# Patient Record
Sex: Male | Born: 1970 | Race: White | Hispanic: No | Marital: Married | Smoking: Former smoker
Health system: Southern US, Community
[De-identification: ages and names within clinical notes are randomized; demographics above are authoritative.]

## PROBLEM LIST (undated history)

## (undated) DIAGNOSIS — N419 Inflammatory disease of prostate, unspecified: Secondary | ICD-10-CM

## (undated) DIAGNOSIS — K219 Gastro-esophageal reflux disease without esophagitis: Secondary | ICD-10-CM

## (undated) DIAGNOSIS — N189 Chronic kidney disease, unspecified: Secondary | ICD-10-CM

## (undated) HISTORY — DX: Chronic kidney disease, unspecified: N18.9

## (undated) HISTORY — DX: Inflammatory disease of prostate, unspecified: N41.9

---

## 1998-02-08 ENCOUNTER — Ambulatory Visit (HOSPITAL_COMMUNITY): Admission: RE | Admit: 1998-02-08 | Discharge: 1998-02-08 | Payer: Self-pay | Admitting: Urology

## 2003-06-01 ENCOUNTER — Ambulatory Visit (HOSPITAL_COMMUNITY): Admission: RE | Admit: 2003-06-01 | Discharge: 2003-06-01 | Payer: Self-pay | Admitting: Family Medicine

## 2003-06-17 ENCOUNTER — Ambulatory Visit (HOSPITAL_COMMUNITY): Admission: RE | Admit: 2003-06-17 | Discharge: 2003-06-17 | Payer: Self-pay | Admitting: Urology

## 2005-02-02 IMAGING — CT CT PELVIS W/ CM
1 of 4 series · 14 of 32 positions shown, 19 images · IV contrast (omnipaque)
Comparison: none

CLINICAL DATA: Right lower quadrant pain.
 CT ABDOMEN AND PELVIS WITH CONTRAST ? 06/01/03
 Multidetector helical CT imaging was performed through the abdomen and pelvis following dilute oral contrast and 150 cc of Omnipaque 300.
 CT ABDOMEN:
 There is mild right hydroureteronephrosis.  No stone is seen in the kidneys or proximal ureters. Please see pelvic CT report below. 
 A small low density lesion is seen in the right tip of the liver compatible with a simple cyst.  The spleen, pancreas, adrenals, and kidneys otherwise show no focal lesions.  The gallbladder and bowel are grossly unremarkable.  
 IMPRESSION
 Mild right hydroureteronephrosis.  Please see pelvic CT report below.
 CT PELVIS:
 The right ureter remains mildly dilated.  There is a 5 mm right ureterovesical junction stone.  The appendix is not definitively seen but no inflammatory process is seen in the right lower quadrant.  The bowel is grossly unremarkable.  No free fluid, free air, or adenopathy.
 5 mm right ureterovesical junction stone.

[Series 2: abd/pelvis 5.0 b30f · axial · 0.64mm/px · z∈[-663,-258]mm · 14 of 93 slices shown, 19 images]
[im 6/93  soft-tissue]
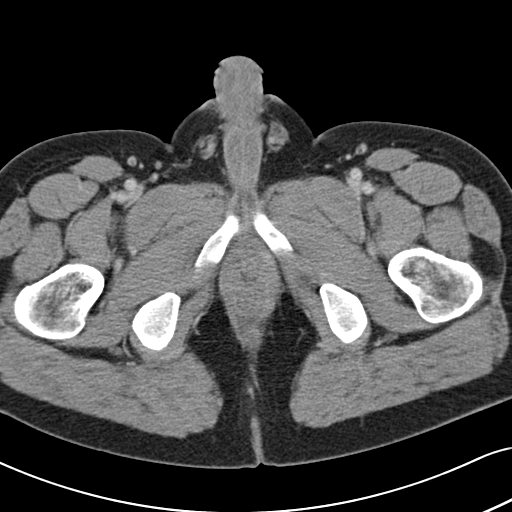
[im 6/93  bone]
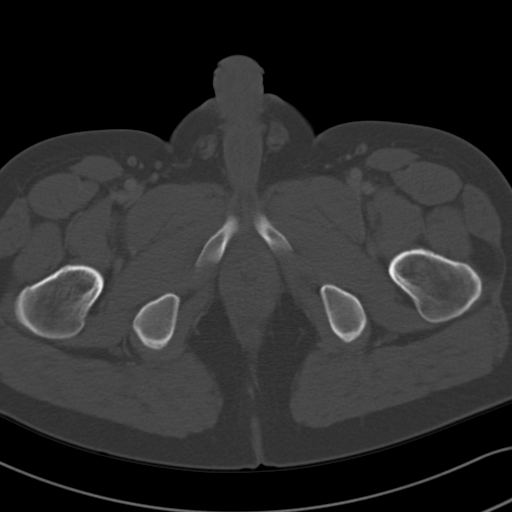
[im 11/93  soft-tissue]
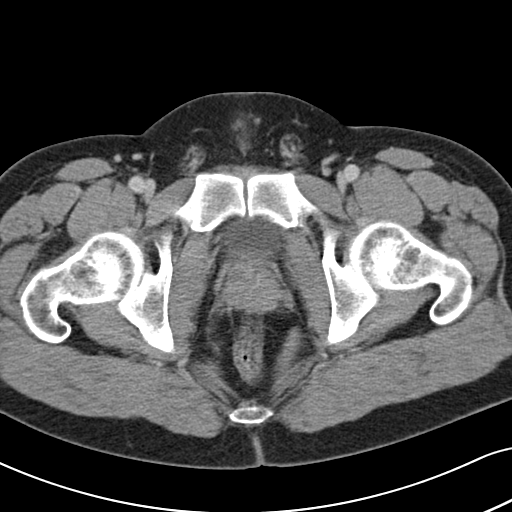
[im 22/93  soft-tissue]
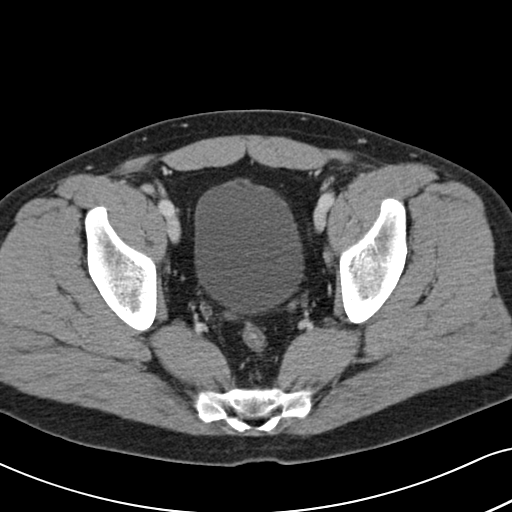
[im 28/93  soft-tissue]
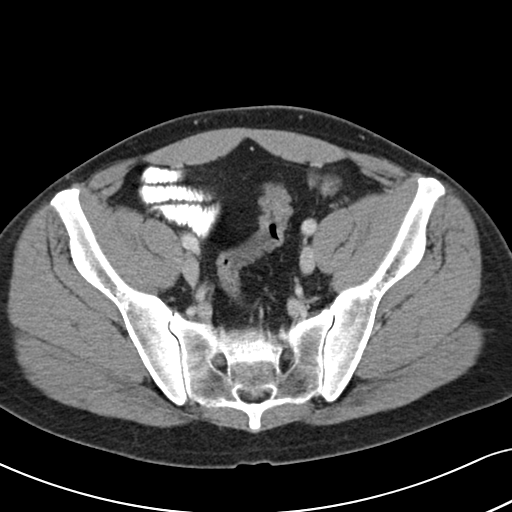
[im 33/93  soft-tissue]
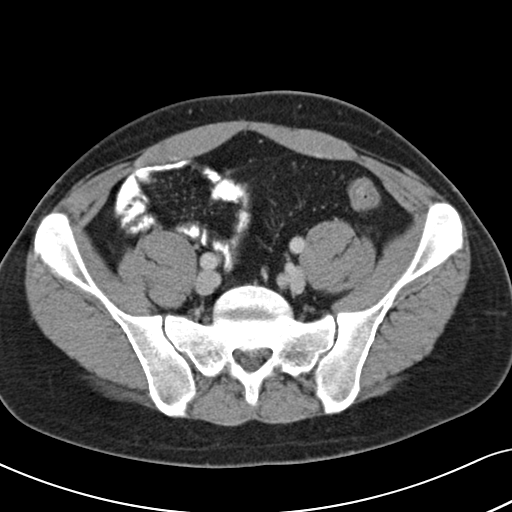
[im 38/93  soft-tissue]
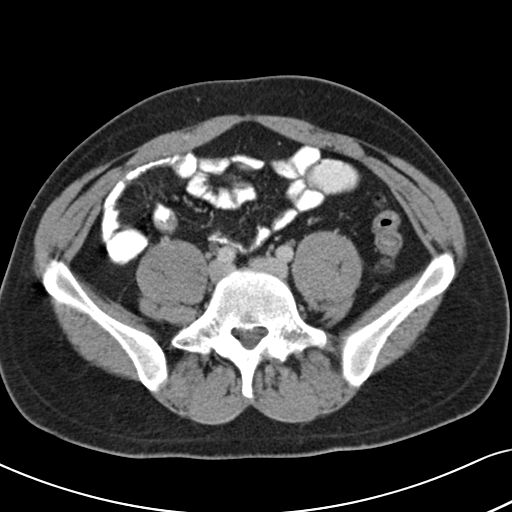
[im 49/93  soft-tissue]
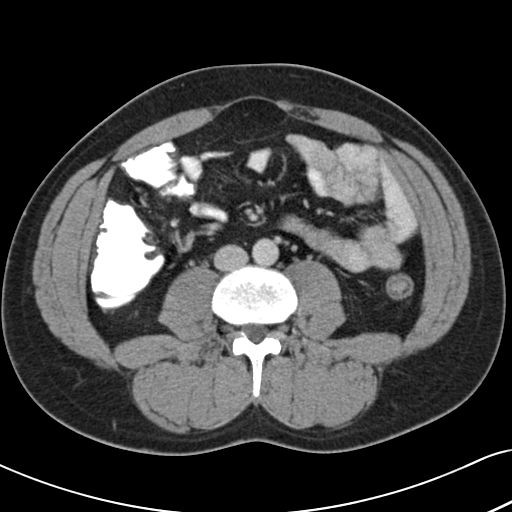
[im 55/93  soft-tissue]
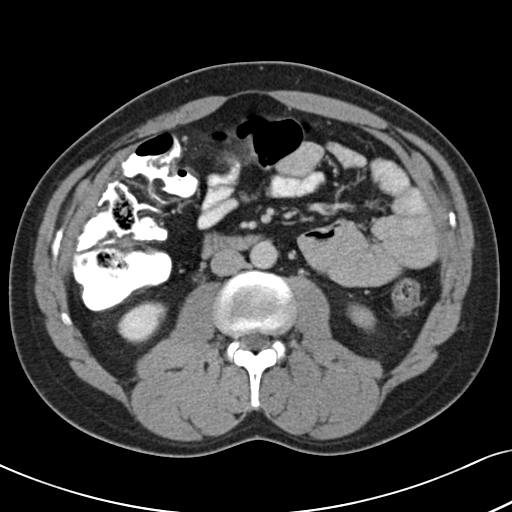
[im 60/93  soft-tissue]
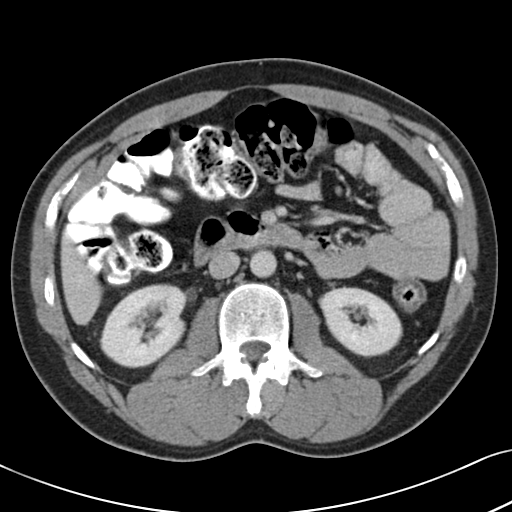
[im 60/93  bone]
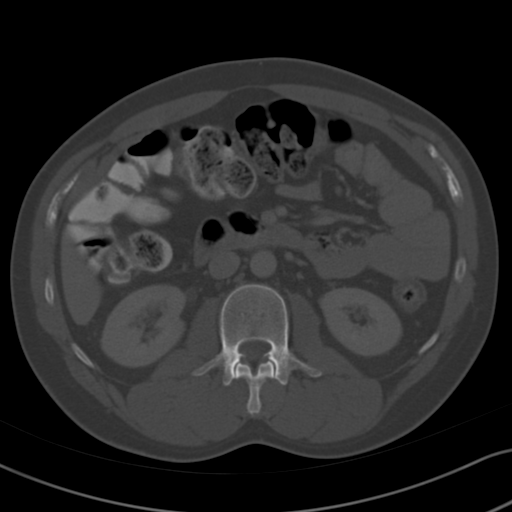
[im 65/93  soft-tissue]
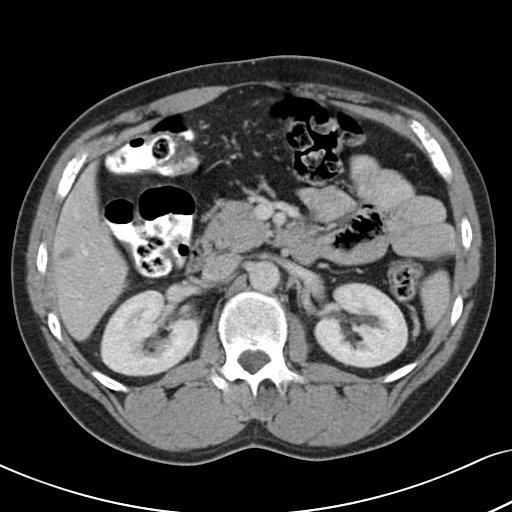
[im 71/93  soft-tissue]
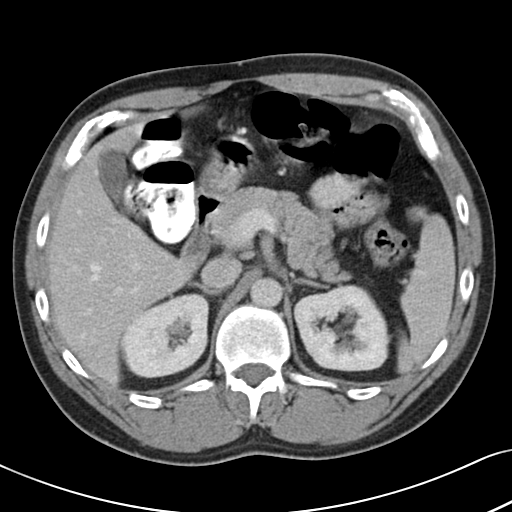
[im 71/93  lung]
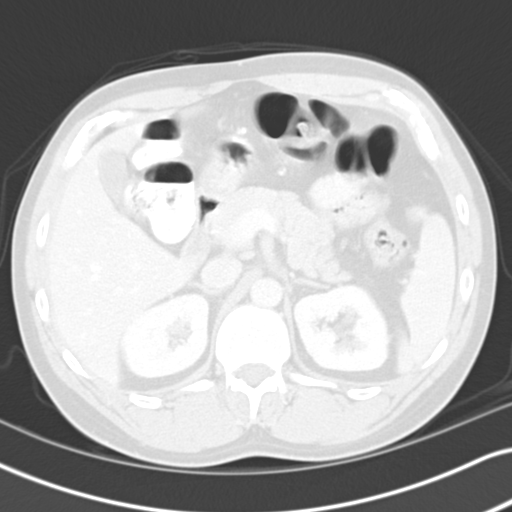
[im 76/93  lung]
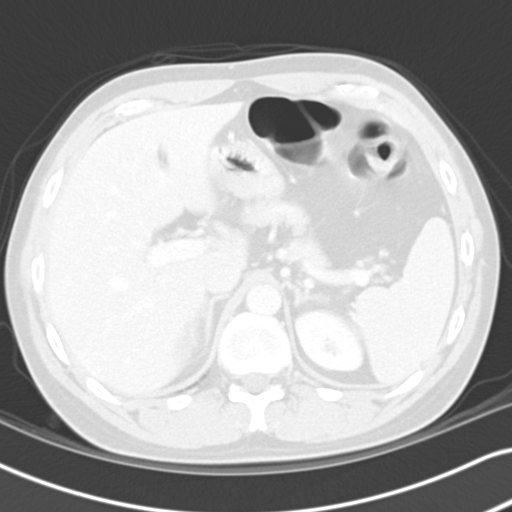
[im 82/93  soft-tissue]
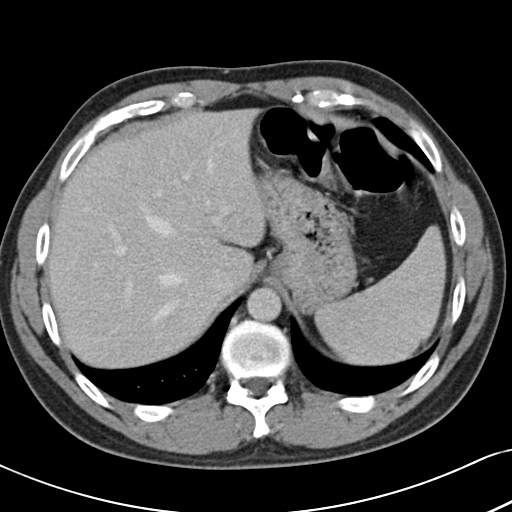
[im 82/93  lung]
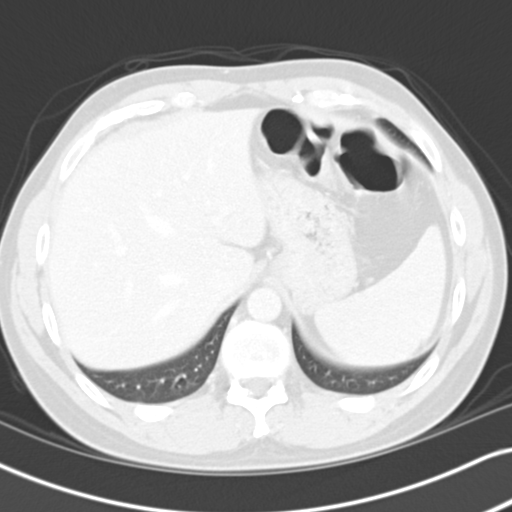
[im 87/93  soft-tissue]
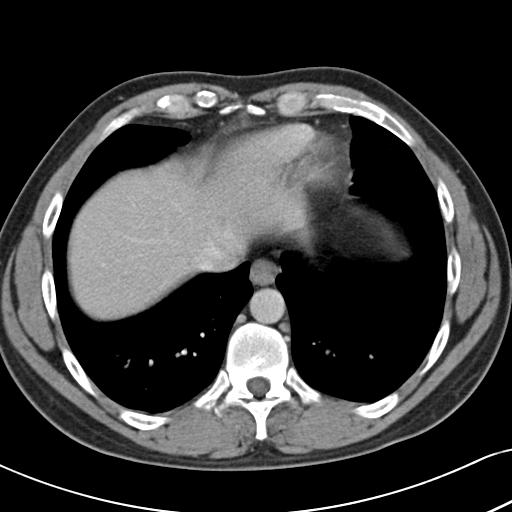
[im 87/93  lung]
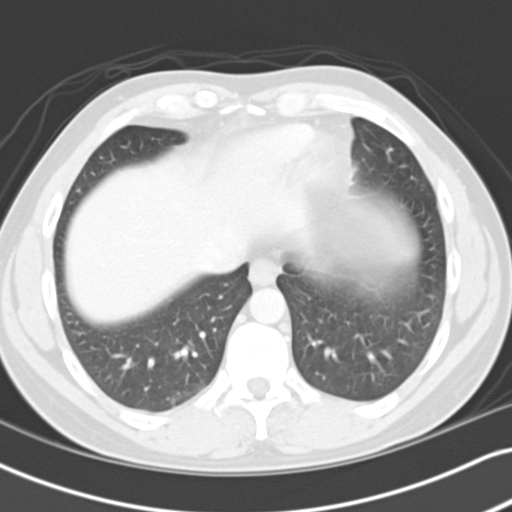

[14 of 32 positions shown; findings below may reference images not displayed]

## 2010-08-14 ENCOUNTER — Ambulatory Visit (INDEPENDENT_AMBULATORY_CARE_PROVIDER_SITE_OTHER): Payer: BC Managed Care – PPO | Admitting: Family Medicine

## 2010-08-14 ENCOUNTER — Encounter: Payer: Self-pay | Admitting: Family Medicine

## 2010-08-14 VITALS — BP 110/88 | HR 72 | Temp 98.5°F | Resp 12 | Ht 68.0 in | Wt 168.0 lb

## 2010-08-14 DIAGNOSIS — Z0000 Encounter for general adult medical examination without abnormal findings: Secondary | ICD-10-CM

## 2010-08-14 LAB — HEPATIC FUNCTION PANEL
ALT: 20 U/L (ref 0–53)
Alkaline Phosphatase: 80 U/L (ref 39–117)
Bilirubin, Direct: 0.2 mg/dL (ref 0.0–0.3)

## 2010-08-14 LAB — CBC WITH DIFFERENTIAL/PLATELET
Basophils Absolute: 0 10*3/uL (ref 0.0–0.1)
Basophils Relative: 0.8 % (ref 0.0–3.0)
HCT: 45.1 % (ref 39.0–52.0)
Hemoglobin: 15.7 g/dL (ref 13.0–17.0)
Lymphocytes Relative: 32.6 % (ref 12.0–46.0)
Monocytes Relative: 6 % (ref 3.0–12.0)
Neutro Abs: 3.8 10*3/uL (ref 1.4–7.7)
RBC: 4.56 Mil/uL (ref 4.22–5.81)
RDW: 12.3 % (ref 11.5–14.6)

## 2010-08-14 LAB — TSH: TSH: 1.66 u[IU]/mL (ref 0.35–5.50)

## 2010-08-14 LAB — BASIC METABOLIC PANEL: Chloride: 101 mEq/L (ref 96–112)

## 2010-08-14 LAB — LIPID PANEL: VLDL: 16.2 mg/dL (ref 0.0–40.0)

## 2010-08-14 NOTE — Progress Notes (Signed)
Subjective:    Patient ID: Manuel Page, male    DOB: 1970-07-28, 40 y.o.   MRN: [id number]  HPI New patient to establish care. Patient request complete physical examination. Past medical history reviewed. History of kidney stones. Otherwise no major problems. No major surgeries. Takes no medications. Allergy to sulfa.  Family history unrevealing. Patient is married. No children. 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 Occasional alcohol.  He and wife are considering adoption. 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  HENT: Negative for ear pain, congestion and trouble swallowing.   Eyes: Negative for pain and visual disturbance.  Respiratory: Negative for cough, shortness of breath and wheezing.   Cardiovascular: Negative for chest pain and palpitations.  Gastrointestinal: Negative for nausea, vomiting, abdominal pain, diarrhea, constipation, blood in stool, abdominal distention and rectal pain.  Genitourinary: Negative for dysuria, hematuria and testicular pain.  Musculoskeletal: Negative for joint swelling and arthralgias.  Skin: Negative for rash.  Neurological: Negative for dizziness, syncope and headaches.  Hematological: Negative for adenopathy.  Psychiatric/Behavioral: Negative for confusion and dysphoric mood.       Objective:   Physical Exam  Constitutional: He is oriented to person, place, and time. He appears well-developed and well-nourished. No distress.  HENT:  Head: Normocephalic and atraumatic.  Right Ear: External ear normal.  Left Ear: External ear normal.  Mouth/Throat: Oropharynx is clear and moist.  Eyes: Conjunctivae and EOM are normal. Pupils are equal, round, and reactive to light.  Neck: Normal range of motion. Neck supple. No thyromegaly present.  Cardiovascular: Normal rate, regular rhythm and normal heart sounds.   No  murmur heard. Pulmonary/Chest: No respiratory distress. He has no wheezes. He has no rales.  Abdominal: Soft. Bowel sounds are normal. He exhibits no distension and no mass. There is no tenderness. There is no rebound and no guarding.  Genitourinary:       Testes somewhat atrophic but no mass  Musculoskeletal: He exhibits no edema.  Lymphadenopathy:    He has no cervical adenopathy.  Neurological: He is alert and oriented to person, place, and time. He displays normal reflexes. No cranial nerve deficit.  Skin: No rash noted.  Psychiatric: He has a normal mood and affect.          Assessment & Plan:  Complete physical examination. Needs to quit smoking. Strategies discussed. Previous intolerance to Chantix. Obtain screening labs. Discussed exercise.

## 2010-08-14 NOTE — Progress Notes (Signed)
Quick Note:  Pt informed and labs mailed to pt home ______

## 2011-08-09 ENCOUNTER — Other Ambulatory Visit (INDEPENDENT_AMBULATORY_CARE_PROVIDER_SITE_OTHER): Payer: BC Managed Care – PPO

## 2011-08-09 DIAGNOSIS — Z0000 Encounter for general adult medical examination without abnormal findings: Secondary | ICD-10-CM

## 2011-08-09 LAB — CBC WITH DIFFERENTIAL/PLATELET
Basophils Absolute: 0 10*3/uL (ref 0.0–0.1)
HCT: 45.4 % (ref 39.0–52.0)
MCV: 100.9 fl — ABNORMAL HIGH (ref 78.0–100.0)
Monocytes Relative: 8 % (ref 3.0–12.0)
Neutro Abs: 2.7 10*3/uL (ref 1.4–7.7)
Neutrophils Relative %: 54.2 % (ref 43.0–77.0)
Platelets: 179 10*3/uL (ref 150.0–400.0)

## 2011-08-09 LAB — POCT URINALYSIS DIPSTICK
Bilirubin, UA: NEGATIVE
Blood, UA: NEGATIVE
Glucose, UA: NEGATIVE
Nitrite, UA: NEGATIVE
Protein, UA: NEGATIVE
Spec Grav, UA: 1.015
pH, UA: 7.5

## 2011-08-09 LAB — HEPATIC FUNCTION PANEL
Albumin: 4.2 g/dL (ref 3.5–5.2)
Total Bilirubin: 0.8 mg/dL (ref 0.3–1.2)

## 2011-08-09 LAB — BASIC METABOLIC PANEL
BUN: 15 mg/dL (ref 6–23)
CO2: 27 mEq/L (ref 19–32)
Chloride: 106 mEq/L (ref 96–112)
GFR: 118.12 mL/min (ref >60.00)
Potassium: 4.1 mEq/L (ref 3.5–5.1)

## 2011-08-09 LAB — LIPID PANEL
Cholesterol: 179 mg/dL (ref 0–200)
VLDL: 20.4 mg/dL (ref 0.0–40.0)

## 2011-08-16 ENCOUNTER — Ambulatory Visit (INDEPENDENT_AMBULATORY_CARE_PROVIDER_SITE_OTHER): Payer: BC Managed Care – PPO | Admitting: Family Medicine

## 2011-08-16 ENCOUNTER — Encounter: Payer: Self-pay | Admitting: Family Medicine

## 2011-08-16 VITALS — BP 132/80 | HR 72 | Temp 98.0°F | Resp 12 | Ht 68.5 in | Wt 172.0 lb

## 2011-08-16 DIAGNOSIS — F17200 Nicotine dependence, unspecified, uncomplicated: Secondary | ICD-10-CM

## 2011-08-16 DIAGNOSIS — Z0000 Encounter for general adult medical examination without abnormal findings: Secondary | ICD-10-CM

## 2011-08-16 MED ORDER — VARENICLINE TARTRATE 1 MG PO TABS: 1.0000 mg | ORAL_TABLET | Freq: Two times a day (BID) | ORAL | Status: AC

## 2011-08-16 MED ORDER — VARENICLINE TARTRATE 0.5 MG X 11 & 1 MG X 42 PO MISC: ORAL | Status: AC

## 2011-08-16 NOTE — Progress Notes (Signed)
Subjective:    Patient ID: Manuel Page, male    DOB: 06-Oct-1970, 41 y.o.   MRN: [id number]  HPI  Patient seen for complete physical examination. He has no chronic medical problems. Ongoing smoking about one pack per day. He previously tried Chantix but had some slight nausea and discontinued. He would like to consider trying this again. He's not sure if the nausea was related to the Chantix.  Past medical history, social history, and family history reviewed. As indicated below. Immunizations up to date. No consistent exercise.  Past Medical History  Diagnosis Date  . Chronic kidney disease     stones  . Prostate infection    No past surgical history on file.  reports that he has been smoking Cigarettes.  He has a 20 pack-year smoking history. He does not have any smokeless tobacco history on file. His alcohol and drug histories not on file. family history includes Hypertension in his father. Allergies  Allergen Reactions  . Sulfa Drugs Cross Reactors     Eye swelling     Review of Systems  Constitutional: Negative for fever, activity change, appetite change, fatigue and unexpected weight change.  HENT: Negative for ear pain, congestion and trouble swallowing.   Eyes: Negative for pain and visual disturbance.  Respiratory: Negative for cough, shortness of breath and wheezing.   Cardiovascular: Negative for chest pain and palpitations.  Gastrointestinal: Negative for nausea, vomiting, abdominal pain, diarrhea, constipation, blood in stool, abdominal distention and rectal pain.  Genitourinary: Negative for dysuria, hematuria and testicular pain.  Musculoskeletal: Negative for joint swelling and arthralgias.  Skin: Negative for rash.  Neurological: Negative for dizziness, syncope and headaches.  Hematological: Negative for adenopathy.  Psychiatric/Behavioral: Negative for confusion and dysphoric mood.       Objective:   Physical Exam  Constitutional: He is oriented to  person, place, and time. He appears well-developed and well-nourished. No distress.  HENT:  Head: Normocephalic and atraumatic.  Right Ear: External ear normal.  Left Ear: External ear normal.  Mouth/Throat: Oropharynx is clear and moist.  Eyes: Conjunctivae and EOM are normal. Pupils are equal, round, and reactive to light.  Neck: Normal range of motion. Neck supple. No thyromegaly present.  Cardiovascular: Normal rate, regular rhythm and normal heart sounds.   No murmur heard. Pulmonary/Chest: No respiratory distress. He has no wheezes. He has no rales.  Abdominal: Soft. Bowel sounds are normal. He exhibits no distension and no mass. There is no tenderness. There is no rebound and no guarding.  Musculoskeletal: He exhibits no edema.  Lymphadenopathy:    He has no cervical adenopathy.  Neurological: He is alert and oriented to person, place, and time. He displays normal reflexes. No cranial nerve deficit.  Skin: No rash noted.  Psychiatric: He has a normal mood and affect.          Assessment & Plan:  Complete physical. Labs reviewed with patient. No major abnormalities. Discussed smoking cessation. Patient requesting repeat trial of Chantix. Prescription given. Reviewed possible side effects.

## 2011-08-16 NOTE — Patient Instructions (Signed)
Smoking Cessation This document explains the best ways for you to quit smoking and new treatments to help. It lists new medicines that can double or triple your chances of quitting and quitting for good. It also considers ways to avoid relapses and concerns you may have about quitting, including weight gain. NICOTINE: A POWERFUL ADDICTION If you have tried to quit smoking, you know how hard it can be. It is hard because nicotine is a very addictive drug. For some people, it can be as addictive as heroin or cocaine. Usually, people make 2 or 3 tries, or more, before finally being able to quit. Each time you try to quit, you can learn about what helps and what hurts. Quitting takes hard work and a lot of effort, but you can quit smoking. QUITTING SMOKING IS ONE OF THE MOST IMPORTANT THINGS YOU WILL EVER DO.  You will live longer, feel better, and live better.   The impact on your body of quitting smoking is felt almost immediately:   Within 20 minutes, blood pressure decreases. Pulse returns to its normal level.   After 8 hours, carbon monoxide levels in the blood return to normal. Oxygen level increases.   After 24 hours, chance of heart attack starts to decrease. Breath, hair, and body stop smelling like smoke.   After 48 hours, damaged nerve endings begin to recover. Sense of taste and smell improve.   After 72 hours, the body is virtually free of nicotine. Bronchial tubes relax and breathing becomes easier.   After 2 to 12 weeks, lungs can hold more air. Exercise becomes easier and circulation improves.   Quitting will reduce your risk of having a heart attack, stroke, cancer, or lung disease:   After 1 year, the risk of coronary heart disease is cut in half.   After 5 years, the risk of stroke falls to the same as a nonsmoker.   After 10 years, the risk of lung cancer is cut in half and the risk of other cancers decreases significantly.   After 15 years, the risk of coronary heart  disease drops, usually to the level of a nonsmoker.   If you are pregnant, quitting smoking will improve your chances of having a healthy baby.   The people you live with, especially your children, will be healthier.   You will have extra money to spend on things other than cigarettes.  FIVE KEYS TO QUITTING Studies have shown that these 5 steps will help you quit smoking and quit for good. You have the best chances of quitting if you use them together: 1. Get ready.  2. Get support and encouragement.  3. Learn new skills and behaviors.  4. Get medicine to reduce your nicotine addiction and use it correctly.  5. Be prepared for relapse or difficult situations. Be determined to continue trying to quit, even if you do not succeed at first.  1. GET READY  Set a quit date.   Change your environment.   Get rid of ALL cigarettes, ashtrays, matches, and lighters in your home, car, and place of work.   Do not let people smoke in your home.   Review your past attempts to quit. Think about what worked and what did not.   Once you quit, do not smoke. NOT EVEN A PUFF!  2. GET SUPPORT AND ENCOURAGEMENT Studies have shown that you have a better chance of being successful if you have help. You can get support in many ways.  Tell   your family, friends, and coworkers that you are going to quit and need their support. Ask them not to smoke around you.   Talk to your caregivers (doctor, dentist, nurse, pharmacist, psychologist, and/or smoking counselor).   Get individual, group, or telephone counseling and support. The more counseling you have, the better your chances are of quitting. Programs are available at local hospitals and health centers. Call your local health department for information about programs in your area.   Spiritual beliefs and practices may help some smokers quit.   Quit meters are small computer programs online or downloadable that keep track of quit statistics, such as amount  of "quit-time," cigarettes not smoked, and money saved.   Many smokers find one or more of the many self-help books available useful in helping them quit and stay off tobacco.  3. LEARN NEW SKILLS AND BEHAVIORS  Try to distract yourself from urges to smoke. Talk to someone, go for a walk, or occupy your time with a task.   When you first try to quit, change your routine. Take a different route to work. Drink tea instead of coffee. Eat breakfast in a different place.   Do something to reduce your stress. Take a hot bath, exercise, or read a book.   Plan something enjoyable to do every day. Reward yourself for not smoking.   Explore interactive web-based programs that specialize in helping you quit.  4. GET MEDICINE AND USE IT CORRECTLY Medicines can help you stop smoking and decrease the urge to smoke. Combining medicine with the above behavioral methods and support can quadruple your chances of successfully quitting smoking. The [place] Food and Drug Administration (FDA) has approved 7 medicines to help you quit smoking. These medicines fall into 3 categories.  Nicotine replacement therapy (delivers nicotine to your body without the negative effects and risks of smoking):   Nicotine gum: Available over-the-counter.   Nicotine lozenges: Available over-the-counter.   Nicotine inhaler: Available by prescription.   Nicotine nasal spray: Available by prescription.   Nicotine skin patches (transdermal): Available by prescription and over-the-counter.   Antidepressant medicine (helps people abstain from smoking, but how this works is unknown):   Bupropion sustained-release (SR) tablets: Available by prescription.   Nicotinic receptor partial agonist (simulates the effect of nicotine in your brain):   Varenicline tartrate tablets: Available by prescription.   Ask your caregiver for advice about which medicines to use and how to use them. Carefully read the information on the package.    Everyone who is trying to quit may benefit from using a medicine. If you are pregnant or trying to become pregnant, nursing an infant, you are under age 18, or you smoke fewer than 10 cigarettes per day, talk to your caregiver before taking any nicotine replacement medicines.   You should stop using a nicotine replacement product and call your caregiver if you experience nausea, dizziness, weakness, vomiting, fast or irregular heartbeat, mouth problems with the lozenge or gum, or redness or swelling of the skin around the patch that does not go away.   Do not use any other product containing nicotine while using a nicotine replacement product.   Talk to your caregiver before using these products if you have diabetes, heart disease, asthma, stomach ulcers, you had a recent heart attack, you have high blood pressure that is not controlled with medicine, a history of irregular heartbeat, or you have been prescribed medicine to help you quit smoking.  5. BE PREPARED FOR RELAPSE OR   DIFFICULT SITUATIONS  Most relapses occur within the first 3 months after quitting. Do not be discouraged if you start smoking again. Remember, most people try several times before they finally quit.   You may have symptoms of withdrawal because your body is used to nicotine. You may crave cigarettes, be irritable, feel very hungry, cough often, get headaches, or have difficulty concentrating.   The withdrawal symptoms are only temporary. They are strongest when you first quit, but they will go away within 10 to 14 days.  Here are some difficult situations to watch for:  Alcohol. Avoid drinking alcohol. Drinking lowers your chances of successfully quitting.   Caffeine. Try to reduce the amount of caffeine you consume. It also lowers your chances of successfully quitting.   Other smokers. Being around smoking can make you want to smoke. Avoid smokers.   Weight gain. Many smokers will gain weight when they quit, usually  less than 10 pounds. Eat a healthy diet and stay active. Do not let weight gain distract you from your main goal, quitting smoking. Some medicines that help you quit smoking may also help delay weight gain. You can always lose the weight gained after you quit.   Bad mood or depression. There are a lot of ways to improve your mood other than smoking.  If you are having problems with any of these situations, talk to your caregiver. SPECIAL SITUATIONS AND CONDITIONS Studies suggest that everyone can quit smoking. Your situation or condition can give you a special reason to quit.  Pregnant women/new mothers: By quitting, you protect your baby's health and your own.   Hospitalized patients: By quitting, you reduce health problems and help healing.   Heart attack patients: By quitting, you reduce your risk of a second heart attack.   Lung, head, and neck cancer patients: By quitting, you reduce your chance of a second cancer.   Parents of children and adolescents: By quitting, you protect your children from illnesses caused by secondhand smoke.  QUESTIONS TO THINK ABOUT Think about the following questions before you try to stop smoking. You may want to talk about your answers with your caregiver.  Why do you want to quit?   If you tried to quit in the past, what helped and what did not?   What will be the most difficult situations for you after you quit? How will you plan to handle them?   Who can help you through the tough times? Your family? Friends? Caregiver?   What pleasures do you get from smoking? What ways can you still get pleasure if you quit?  Here are some questions to ask your caregiver:  How can you help me to be successful at quitting?   What medicine do you think would be best for me and how should I take it?   What should I do if I need more help?   What is smoking withdrawal like? How can I get information on withdrawal?  Quitting takes hard work and a lot of effort,  but you can quit smoking. FOR MORE INFORMATION  Smokefree.gov (http://www.smokefree.gov) provides free, accurate, evidence-based information and professional assistance to help support the immediate and long-term needs of people trying to quit smoking. Document Released: 02/06/2001 Document Revised: 02/01/2011 Document Reviewed: 11/29/2008 [employer] Patient Information 2012 [employer]. 

## 2012-09-17 ENCOUNTER — Other Ambulatory Visit (INDEPENDENT_AMBULATORY_CARE_PROVIDER_SITE_OTHER): Payer: BC Managed Care – PPO

## 2012-09-17 DIAGNOSIS — Z0000 Encounter for general adult medical examination without abnormal findings: Secondary | ICD-10-CM

## 2012-09-17 LAB — CBC WITH DIFFERENTIAL/PLATELET
Basophils Relative: 0.8 % (ref 0.0–3.0)
Eosinophils Absolute: 0.1 10*3/uL (ref 0.0–0.7)
HCT: 44.1 % (ref 39.0–52.0)
Lymphocytes Relative: 28.9 % (ref 12.0–46.0)
Lymphs Abs: 1.8 10*3/uL (ref 0.7–4.0)
MCHC: 34.9 g/dL (ref 30.0–36.0)
Monocytes Absolute: 0.4 10*3/uL (ref 0.1–1.0)
Neutro Abs: 4 10*3/uL (ref 1.4–7.7)
Neutrophils Relative %: 62.7 % (ref 43.0–77.0)
RDW: 11.9 % (ref 11.5–14.6)

## 2012-09-17 LAB — BASIC METABOLIC PANEL
GFR: 126.95 mL/min (ref >60.00)
Sodium: 138 mEq/L (ref 135–145)

## 2012-09-17 LAB — HEPATIC FUNCTION PANEL
ALT: 23 U/L (ref 0–53)
AST: 16 U/L (ref 0–37)
Alkaline Phosphatase: 71 U/L (ref 39–117)
Total Protein: 6.6 g/dL (ref 6.0–8.3)

## 2012-09-17 LAB — POCT URINALYSIS DIPSTICK
Blood, UA: NEGATIVE
Ketones, UA: NEGATIVE
Nitrite, UA: NEGATIVE
Urobilinogen, UA: 0.2

## 2012-09-17 LAB — LIPID PANEL
Cholesterol: 177 mg/dL (ref 0–200)
Triglycerides: 131 mg/dL (ref 0.0–149.0)
VLDL: 26.2 mg/dL (ref 0.0–40.0)

## 2012-09-22 ENCOUNTER — Encounter: Payer: Self-pay | Admitting: Family Medicine

## 2012-09-22 ENCOUNTER — Ambulatory Visit (INDEPENDENT_AMBULATORY_CARE_PROVIDER_SITE_OTHER): Payer: BC Managed Care – PPO | Admitting: Family Medicine

## 2012-09-22 VITALS — BP 128/70 | HR 72 | Temp 98.4°F | Ht 70.0 in | Wt 173.0 lb

## 2012-09-22 DIAGNOSIS — Z0000 Encounter for general adult medical examination without abnormal findings: Secondary | ICD-10-CM

## 2012-09-22 NOTE — Progress Notes (Signed)
Subjective:    Patient ID: Manuel Page, male    DOB: Aug 17, 1970, 42 y.o.   MRN: [id number]  HPI Patient here for physical exam. Still smoking about one pack per day. He's tried various things in the past without success. No consistent exercise. Last tetanus 2005. He has no specific complaints today. Remote hx of kidney stone.  Family history revealing only that father had hypertension.  Past Medical History  Diagnosis Date  . Prostate infection   . Chronic kidney disease     stones   No past surgical history on file.  reports that he has been smoking Cigarettes.  He has a 20 pack-year smoking history. He does not have any smokeless tobacco history on file. His alcohol and drug histories are not on file. family history includes Hypertension in his father. Allergies  Allergen Reactions  . Sulfa Drugs Cross Reactors     Eye swelling      Review of Systems  Constitutional: Negative for fever, activity change, appetite change and fatigue.  HENT: Negative for ear pain, congestion and trouble swallowing.   Eyes: Negative for pain and visual disturbance.  Respiratory: Negative for cough, shortness of breath and wheezing.   Cardiovascular: Negative for chest pain and palpitations.  Gastrointestinal: Negative for nausea, vomiting, abdominal pain, diarrhea, constipation, blood in stool, abdominal distention and rectal pain.  Genitourinary: Negative for dysuria, hematuria and testicular pain.  Musculoskeletal: Negative for joint swelling and arthralgias.  Skin: Negative for rash.  Neurological: Negative for dizziness, syncope and headaches.  Hematological: Negative for adenopathy.  Psychiatric/Behavioral: Negative for confusion and dysphoric mood.       Objective:   Physical Exam  Constitutional: He is oriented to person, place, and time. He appears well-developed and well-nourished. No distress.  HENT:  Head: Normocephalic and atraumatic.  Right Ear: External ear normal.   Left Ear: External ear normal.  Mouth/Throat: Oropharynx is clear and moist.  Eyes: Conjunctivae and EOM are normal. Pupils are equal, round, and reactive to light.  Neck: Normal range of motion. Neck supple. No thyromegaly present.  Cardiovascular: Normal rate, regular rhythm and normal heart sounds.   No murmur heard. Pulmonary/Chest: No respiratory distress. He has no wheezes. He has no rales.  Abdominal: Soft. Bowel sounds are normal. He exhibits no distension and no mass. There is no tenderness. There is no rebound and no guarding.  Musculoskeletal: He exhibits no edema.  Lymphadenopathy:    He has no cervical adenopathy.  Neurological: He is alert and oriented to person, place, and time. He displays normal reflexes. No cranial nerve deficit.  Skin: No rash noted.  Psychiatric: He has a normal mood and affect.          Assessment & Plan:  Complete physical. Labs reviewed. Smoking cessation discussed. Current motivation is fair. He has tried multiple things in past and is considering stopping "on his own." Discussed more consistent exercise to try to help increase his HDL.

## 2012-09-22 NOTE — Patient Instructions (Addendum)

## 2013-01-01 ENCOUNTER — Other Ambulatory Visit: Payer: Self-pay

## 2013-09-21 ENCOUNTER — Other Ambulatory Visit: Payer: BC Managed Care – PPO

## 2013-09-24 ENCOUNTER — Other Ambulatory Visit (INDEPENDENT_AMBULATORY_CARE_PROVIDER_SITE_OTHER): Payer: BC Managed Care – PPO

## 2013-09-24 DIAGNOSIS — Z0000 Encounter for general adult medical examination without abnormal findings: Secondary | ICD-10-CM

## 2013-09-24 LAB — LIPID PANEL
Cholesterol: 178 mg/dL (ref 0–200)
HDL: 38 mg/dL — AB (ref >39.00)
LDL CALC: 123 mg/dL — AB (ref 0–99)
NONHDL: 140
Total CHOL/HDL Ratio: 5
Triglycerides: 85 mg/dL (ref 0.0–149.0)
VLDL: 17 mg/dL (ref 0.0–40.0)

## 2013-09-24 LAB — POCT URINALYSIS DIPSTICK
Bilirubin, UA: NEGATIVE
Glucose, UA: NEGATIVE
KETONES UA: NEGATIVE
Leukocytes, UA: NEGATIVE
Nitrite, UA: NEGATIVE
PH UA: 7
SPEC GRAV UA: 1.015
Urobilinogen, UA: 0.2

## 2013-09-24 LAB — CBC WITH DIFFERENTIAL/PLATELET
BASOS ABS: 0 10*3/uL (ref 0.0–0.1)
BASOS PCT: 0.4 % (ref 0.0–3.0)
EOS ABS: 0.1 10*3/uL (ref 0.0–0.7)
EOS PCT: 1.2 % (ref 0.0–5.0)
HEMATOCRIT: 44.5 % (ref 39.0–52.0)
HEMOGLOBIN: 15.3 g/dL (ref 13.0–17.0)
LYMPHS PCT: 26.4 % (ref 12.0–46.0)
Lymphs Abs: 1.8 10*3/uL (ref 0.7–4.0)
MCHC: 34.5 g/dL (ref 30.0–36.0)
MCV: 100 fl (ref 78.0–100.0)
MONOS PCT: 6.4 % (ref 3.0–12.0)
Monocytes Absolute: 0.4 10*3/uL (ref 0.1–1.0)
NEUTROS PCT: 65.6 % (ref 43.0–77.0)
Neutro Abs: 4.4 10*3/uL (ref 1.4–7.7)
Platelets: 224 10*3/uL (ref 150.0–400.0)
RBC: 4.44 Mil/uL (ref 4.22–5.81)
RDW: 12.3 % (ref 11.5–15.5)
WBC: 6.8 10*3/uL (ref 4.0–10.5)

## 2013-09-24 LAB — BASIC METABOLIC PANEL
BUN: 8 mg/dL (ref 6–23)
CO2: 25 meq/L (ref 19–32)
Calcium: 9.3 mg/dL (ref 8.4–10.5)
Chloride: 107 mEq/L (ref 96–112)
Creatinine, Ser: 0.8 mg/dL (ref 0.4–1.5)
GFR: 107.23 mL/min (ref >60.00)
GLUCOSE: 84 mg/dL (ref 70–99)
Potassium: 3.7 mEq/L (ref 3.5–5.1)
Sodium: 140 mEq/L (ref 135–145)

## 2013-09-24 LAB — HEPATIC FUNCTION PANEL
ALT: 16 U/L (ref 0–53)
AST: 16 U/L (ref 0–37)
Albumin: 4.2 g/dL (ref 3.5–5.2)
Alkaline Phosphatase: 85 U/L (ref 39–117)
BILIRUBIN DIRECT: 0.1 mg/dL (ref 0.0–0.3)
TOTAL PROTEIN: 7 g/dL (ref 6.0–8.3)
Total Bilirubin: 0.7 mg/dL (ref 0.2–1.2)

## 2013-09-24 LAB — TSH: TSH: 0.98 u[IU]/mL (ref 0.35–4.50)

## 2013-09-28 ENCOUNTER — Encounter: Payer: BC Managed Care – PPO | Admitting: Family Medicine

## 2013-10-07 ENCOUNTER — Ambulatory Visit (INDEPENDENT_AMBULATORY_CARE_PROVIDER_SITE_OTHER): Payer: BC Managed Care – PPO | Admitting: Family Medicine

## 2013-10-07 ENCOUNTER — Encounter: Payer: Self-pay | Admitting: Family Medicine

## 2013-10-07 VITALS — BP 126/80 | HR 70 | Temp 98.3°F | Ht 70.0 in | Wt 173.0 lb

## 2013-10-07 DIAGNOSIS — Z23 Encounter for immunization: Secondary | ICD-10-CM

## 2013-10-07 DIAGNOSIS — R319 Hematuria, unspecified: Secondary | ICD-10-CM

## 2013-10-07 DIAGNOSIS — Z0000 Encounter for general adult medical examination without abnormal findings: Secondary | ICD-10-CM

## 2013-10-07 LAB — POCT URINALYSIS DIPSTICK
Bilirubin, UA: NEGATIVE
Glucose, UA: NEGATIVE
Ketones, UA: NEGATIVE
LEUKOCYTES UA: NEGATIVE
NITRITE UA: NEGATIVE
Protein, UA: NEGATIVE
RBC UA: NEGATIVE
Spec Grav, UA: 1.015
Urobilinogen, UA: 4
pH, UA: 8.5

## 2013-10-07 NOTE — Progress Notes (Signed)
Pre visit review using our clinic review tool, if applicable. No additional management support is needed unless otherwise documented below in the visit note. 

## 2013-10-07 NOTE — Progress Notes (Signed)
Subjective:    Patient ID: Manuel Page, male    DOB: 1970-05-14, 43 y.o.   MRN: [id number]  HPI Patient here for complete physical. He has no active chronic medical problems. Still smokes. Low motivation to quit. Last tetanus 10 years ago. Takes no regular medications. No family history of cancer or premature heart disease. No consistent exercise.  Past Medical History  Diagnosis Date  . Prostate infection   . Chronic kidney disease     stones   No past surgical history on file.  reports that he has been smoking Cigarettes.  He has a 20 pack-year smoking history. He does not have any smokeless tobacco history on file. His alcohol and drug histories are not on file. family history includes Hypertension in his father. Allergies  Allergen Reactions  . Sulfa Drugs Cross Reactors     Eye swelling      Review of Systems  Constitutional: Negative for fever, activity change, appetite change, fatigue and unexpected weight change.  HENT: Negative for congestion, ear pain and trouble swallowing.   Eyes: Negative for pain and visual disturbance.  Respiratory: Negative for cough, shortness of breath and wheezing.   Cardiovascular: Negative for chest pain and palpitations.  Gastrointestinal: Negative for nausea, vomiting, abdominal pain, diarrhea, constipation, blood in stool, abdominal distention and rectal pain.  Endocrine: Negative for polydipsia and polyuria.  Genitourinary: Negative for dysuria, hematuria and testicular pain.  Musculoskeletal: Negative for arthralgias and joint swelling.  Skin: Negative for rash.  Neurological: Negative for dizziness, syncope and headaches.  Hematological: Negative for adenopathy.  Psychiatric/Behavioral: Negative for confusion and dysphoric mood.       Objective:   Physical Exam  Constitutional: He is oriented to person, place, and time. He appears well-developed and well-nourished. No distress.  HENT:  Head: Normocephalic and atraumatic.    Right Ear: External ear normal.  Left Ear: External ear normal.  Mouth/Throat: Oropharynx is clear and moist.  Eyes: Conjunctivae and EOM are normal. Pupils are equal, round, and reactive to light.  Neck: Normal range of motion. Neck supple. No thyromegaly present.  Cardiovascular: Normal rate, regular rhythm and normal heart sounds.   No murmur heard. Pulmonary/Chest: No respiratory distress. He has no wheezes. He has no rales.  Abdominal: Soft. Bowel sounds are normal. He exhibits no distension and no mass. There is no tenderness. There is no rebound and no guarding.  Musculoskeletal: He exhibits no edema.  Lymphadenopathy:    He has no cervical adenopathy.  Neurological: He is alert and oriented to person, place, and time. He displays normal reflexes. No cranial nerve deficit.  Skin: No rash noted.  Psychiatric: He has a normal mood and affect.          Assessment & Plan:  Complete physical. Smoking cessation discussed. Tetanus booster given. Labs reviewed. No major abnormalities. We discussed consideration for pneumonia vaccine he is undecided at this time

## 2013-10-16 ENCOUNTER — Encounter: Payer: Self-pay | Admitting: Family Medicine

## 2013-10-19 ENCOUNTER — Other Ambulatory Visit: Payer: Self-pay

## 2013-10-19 MED ORDER — EPINEPHRINE 0.3 MG/0.3ML IJ SOAJ: 0.3000 mg | Freq: Once | INTRAMUSCULAR | Status: AC

## 2014-05-27 ENCOUNTER — Ambulatory Visit (INDEPENDENT_AMBULATORY_CARE_PROVIDER_SITE_OTHER): Payer: BLUE CROSS/BLUE SHIELD | Admitting: Family Medicine

## 2014-05-27 ENCOUNTER — Encounter: Payer: Self-pay | Admitting: Family Medicine

## 2014-05-27 ENCOUNTER — Telehealth: Payer: Self-pay | Admitting: Family Medicine

## 2014-05-27 VITALS — BP 128/74 | HR 83 | Temp 98.6°F | Wt 172.0 lb

## 2014-05-27 DIAGNOSIS — J069 Acute upper respiratory infection, unspecified: Secondary | ICD-10-CM

## 2014-05-27 DIAGNOSIS — B9789 Other viral agents as the cause of diseases classified elsewhere: Principal | ICD-10-CM

## 2014-05-27 MED ORDER — HYDROCODONE-HOMATROPINE 5-1.5 MG/5ML PO SYRP: 5.0000 mL | ORAL_SOLUTION | Freq: Four times a day (QID) | ORAL | Status: AC | PRN

## 2014-05-27 NOTE — Telephone Encounter (Signed)
emmi emailed °

## 2014-05-27 NOTE — Progress Notes (Signed)
Pre visit review using our clinic review tool, if applicable. No additional management support is needed unless otherwise documented below in the visit note. 

## 2014-05-27 NOTE — Progress Notes (Signed)
Subjective:    Patient ID: Manuel Page, male    DOB: [birth year], 44 y.o.   MRN: [id number]  HPI Acute visit. Patient seen with onset about 3 days ago of cough, sinus congestion, fatigue, body aches. Has not had any documented fever. Cough mostly nonproductive. Not relieved with over-the-counter medications. Denies any nausea or vomiting. No sick contacts. Does smoke.  Past Medical History  Diagnosis Date  . Prostate infection   . Chronic kidney disease     stones   No past surgical history on file.  reports that he has been smoking Cigarettes.  He has a 20 pack-year smoking history. He does not have any smokeless tobacco history on file. His alcohol and drug histories are not on file. family history includes Hypertension in his father. Allergies  Allergen Reactions  . Sulfa Drugs Cross Reactors     Eye swelling      Review of Systems  Constitutional: Positive for chills and fatigue.  HENT: Positive for congestion.   Respiratory: Positive for cough.        Objective:   Physical Exam  Constitutional: He appears well-developed and well-nourished.  HENT:  Right Ear: External ear normal.  Left Ear: External ear normal.  Mild erythema posterior pharynx. No exudate  Neck: Neck supple.  Cardiovascular: Normal rate and regular rhythm.   Pulmonary/Chest: Effort normal and breath sounds normal. No respiratory distress. He has no wheezes. He has no rales.  Lymphadenopathy:    He has no cervical adenopathy.          Assessment & Plan:  Probable viral URI with cough. Symptomatic treatment. Hycodan cough syrup 1 teaspoon daily at bedtime for severe cough. Follow-up as needed

## 2014-05-27 NOTE — Patient Instructions (Signed)

## 2014-06-01 ENCOUNTER — Telehealth: Payer: Self-pay | Admitting: Family Medicine

## 2014-06-01 NOTE — Telephone Encounter (Signed)
Pt was seen on 3-31 and still having sinus issues. Pt has been running a fever on fri,sat and finally fever left on Sunday night. [facility] advise. [facility]  [employer]

## 2014-06-01 NOTE — Telephone Encounter (Signed)
If no PCN allergy, Augmentin 875 mg twice daily for 10 days.

## 2014-06-02 MED ORDER — AMOXICILLIN-POT CLAVULANATE 875-125 MG PO TABS: 1.0000 | ORAL_TABLET | Freq: Two times a day (BID) | ORAL | Status: DC

## 2014-06-02 NOTE — Telephone Encounter (Signed)
Pt said no PCN allergy. Rx sent to pharmacy .

## 2014-06-07 ENCOUNTER — Encounter: Payer: Self-pay | Admitting: Family Medicine

## 2014-09-30 ENCOUNTER — Other Ambulatory Visit (INDEPENDENT_AMBULATORY_CARE_PROVIDER_SITE_OTHER): Payer: BLUE CROSS/BLUE SHIELD

## 2014-09-30 DIAGNOSIS — Z0000 Encounter for general adult medical examination without abnormal findings: Secondary | ICD-10-CM

## 2014-09-30 LAB — CBC WITH DIFFERENTIAL/PLATELET
BASOS ABS: 0 10*3/uL (ref 0.0–0.1)
Basophils Relative: 0.7 % (ref 0.0–3.0)
EOS ABS: 0.1 10*3/uL (ref 0.0–0.7)
Eosinophils Relative: 1.3 % (ref 0.0–5.0)
HCT: 43.9 % (ref 39.0–52.0)
HEMOGLOBIN: 15.2 g/dL (ref 13.0–17.0)
LYMPHS ABS: 1.9 10*3/uL (ref 0.7–4.0)
LYMPHS PCT: 30.2 % (ref 12.0–46.0)
MCHC: 34.6 g/dL (ref 30.0–36.0)
MCV: 98.6 fl (ref 78.0–100.0)
Monocytes Absolute: 0.5 10*3/uL (ref 0.1–1.0)
Monocytes Relative: 7.4 % (ref 3.0–12.0)
NEUTROS PCT: 60.4 % (ref 43.0–77.0)
Neutro Abs: 3.8 10*3/uL (ref 1.4–7.7)
Platelets: 216 10*3/uL (ref 150.0–400.0)
RBC: 4.45 Mil/uL (ref 4.22–5.81)
RDW: 12.3 % (ref 11.5–15.5)
WBC: 6.3 10*3/uL (ref 4.0–10.5)

## 2014-09-30 LAB — BASIC METABOLIC PANEL
BUN: 12 mg/dL (ref 6–23)
CALCIUM: 9.4 mg/dL (ref 8.4–10.5)
CO2: 27 meq/L (ref 19–32)
CREATININE: 0.86 mg/dL (ref 0.40–1.50)
Chloride: 106 mEq/L (ref 96–112)
GFR: 102.44 mL/min (ref >60.00)
GLUCOSE: 84 mg/dL (ref 70–99)
Potassium: 4.1 mEq/L (ref 3.5–5.1)
SODIUM: 140 meq/L (ref 135–145)

## 2014-09-30 LAB — LIPID PANEL
Cholesterol: 175 mg/dL (ref 0–200)
HDL: 41.7 mg/dL (ref >39.00)
LDL CALC: 117 mg/dL — AB (ref 0–99)
NonHDL: 133.53
TRIGLYCERIDES: 85 mg/dL (ref 0.0–149.0)
Total CHOL/HDL Ratio: 4
VLDL: 17 mg/dL (ref 0.0–40.0)

## 2014-09-30 LAB — HEPATIC FUNCTION PANEL
ALBUMIN: 4.4 g/dL (ref 3.5–5.2)
ALT: 14 U/L (ref 0–53)
AST: 14 U/L (ref 0–37)
Alkaline Phosphatase: 103 U/L (ref 39–117)
BILIRUBIN TOTAL: 0.7 mg/dL (ref 0.2–1.2)
Bilirubin, Direct: 0.2 mg/dL (ref 0.0–0.3)
Total Protein: 6.7 g/dL (ref 6.0–8.3)

## 2014-09-30 LAB — TSH: TSH: 1.18 u[IU]/mL (ref 0.35–4.50)

## 2014-10-08 ENCOUNTER — Encounter: Payer: Self-pay | Admitting: Family Medicine

## 2014-10-08 ENCOUNTER — Ambulatory Visit (INDEPENDENT_AMBULATORY_CARE_PROVIDER_SITE_OTHER): Payer: BLUE CROSS/BLUE SHIELD | Admitting: Family Medicine

## 2014-10-08 VITALS — BP 124/80 | HR 81 | Temp 98.2°F | Ht 68.5 in | Wt 171.0 lb

## 2014-10-08 DIAGNOSIS — Z0000 Encounter for general adult medical examination without abnormal findings: Secondary | ICD-10-CM | POA: Diagnosis not present

## 2014-10-08 NOTE — Progress Notes (Signed)
Subjective:    Patient ID: Manuel Page, male    DOB: June 05, 1970, 44 y.o.   MRN: [id number]  HPI Patient here for complete physical. Generally healthy. He does smoke a pack cigarettes per day. He does desire to quit. We have discussed previously getting Pneumovax but he declines. He does get yearly flu vaccines. He does not taking regular medications. No recent chest pains.  Reviewed with no changes  Past Medical History  Diagnosis Date  . Prostate infection   . Chronic kidney disease     stones   No past surgical history on file.  reports that he has been smoking Cigarettes.  He has a 20 pack-year smoking history. He does not have any smokeless tobacco history on file. His alcohol and drug histories are not on file. family history includes Hypertension in his father. Allergies  Allergen Reactions  . Sulfa Drugs Cross Reactors     Eye swelling      Review of Systems  Constitutional: Negative for fever, activity change, appetite change and fatigue.  HENT: Negative for congestion, ear pain and trouble swallowing.   Eyes: Negative for pain and visual disturbance.  Respiratory: Negative for cough, shortness of breath and wheezing.   Cardiovascular: Negative for chest pain and palpitations.  Gastrointestinal: Negative for nausea, vomiting, abdominal pain, diarrhea, constipation, blood in stool, abdominal distention and rectal pain.  Genitourinary: Negative for dysuria, hematuria and testicular pain.  Musculoskeletal: Negative for joint swelling and arthralgias.  Skin: Negative for rash.  Neurological: Negative for dizziness, syncope and headaches.  Hematological: Negative for adenopathy.  Psychiatric/Behavioral: Negative for confusion and dysphoric mood.       Objective:   Physical Exam  Constitutional: He is oriented to person, place, and time. He appears well-developed and well-nourished. No distress.  HENT:  Head: Normocephalic and atraumatic.  Right Ear: External  ear normal.  Left Ear: External ear normal.  Mouth/Throat: Oropharynx is clear and moist.  Eyes: Conjunctivae and EOM are normal. Pupils are equal, round, and reactive to light.  Neck: Normal range of motion. Neck supple. No thyromegaly present.  Cardiovascular: Normal rate, regular rhythm and normal heart sounds.   No murmur heard. Pulmonary/Chest: No respiratory distress. He has no wheezes. He has no rales.  Abdominal: Soft. Bowel sounds are normal. He exhibits no distension and no mass. There is no tenderness. There is no rebound and no guarding.  Musculoskeletal: He exhibits no edema.  Lymphadenopathy:    He has no cervical adenopathy.  Neurological: He is alert and oriented to person, place, and time. He displays normal reflexes. No cranial nerve deficit.  Skin: No rash noted.  Psychiatric: He has a normal mood and affect.          Assessment & Plan:  Complete physical. Labs reviewed. No major concerns. We discussed smoking cessation at some length. Information from [place] quit line given.

## 2014-10-08 NOTE — Progress Notes (Signed)
Pre visit review using our clinic review tool, if applicable. No additional management support is needed unless otherwise documented below in the visit note. 

## 2015-03-17 ENCOUNTER — Encounter: Payer: Self-pay | Admitting: Family Medicine

## 2015-03-17 ENCOUNTER — Other Ambulatory Visit: Payer: Self-pay | Admitting: Family Medicine

## 2015-03-17 MED ORDER — VARENICLINE TARTRATE 1 MG PO TABS: 1.0000 mg | ORAL_TABLET | Freq: Two times a day (BID) | ORAL | Status: DC

## 2015-03-17 MED ORDER — VARENICLINE TARTRATE 0.5 MG X 11 & 1 MG X 42 PO MISC: ORAL | Status: DC

## 2015-03-17 NOTE — Telephone Encounter (Signed)
CPE was 10-08-2014 No pending appt Okay for RX for need an appt to discuss?

## 2015-10-13 ENCOUNTER — Other Ambulatory Visit (INDEPENDENT_AMBULATORY_CARE_PROVIDER_SITE_OTHER): Payer: 59

## 2015-10-13 DIAGNOSIS — Z0000 Encounter for general adult medical examination without abnormal findings: Secondary | ICD-10-CM | POA: Diagnosis not present

## 2015-10-13 LAB — CBC WITH DIFFERENTIAL/PLATELET
BASOS ABS: 0 10*3/uL (ref 0.0–0.1)
Basophils Relative: 0.6 % (ref 0.0–3.0)
Eosinophils Absolute: 0.1 10*3/uL (ref 0.0–0.7)
Eosinophils Relative: 1.1 % (ref 0.0–5.0)
HCT: 44.1 % (ref 39.0–52.0)
Hemoglobin: 15.4 g/dL (ref 13.0–17.0)
LYMPHS ABS: 2.1 10*3/uL (ref 0.7–4.0)
Lymphocytes Relative: 28.8 % (ref 12.0–46.0)
MCHC: 34.8 g/dL (ref 30.0–36.0)
MCV: 98.7 fl (ref 78.0–100.0)
MONO ABS: 0.5 10*3/uL (ref 0.1–1.0)
Monocytes Relative: 6.7 % (ref 3.0–12.0)
NEUTROS PCT: 62.8 % (ref 43.0–77.0)
Neutro Abs: 4.5 10*3/uL (ref 1.4–7.7)
Platelets: 220 10*3/uL (ref 150.0–400.0)
RBC: 4.47 Mil/uL (ref 4.22–5.81)
RDW: 12.1 % (ref 11.5–15.5)
WBC: 7.1 10*3/uL (ref 4.0–10.5)

## 2015-10-13 LAB — BASIC METABOLIC PANEL
BUN: 10 mg/dL (ref 6–23)
CALCIUM: 9.5 mg/dL (ref 8.4–10.5)
CO2: 26 mEq/L (ref 19–32)
Chloride: 107 mEq/L (ref 96–112)
Creatinine, Ser: 0.86 mg/dL (ref 0.40–1.50)
GFR: 101.96 mL/min (ref >60.00)
GLUCOSE: 89 mg/dL (ref 70–99)
Potassium: 3.8 mEq/L (ref 3.5–5.1)
Sodium: 139 mEq/L (ref 135–145)

## 2015-10-13 LAB — LIPID PANEL
CHOLESTEROL: 191 mg/dL (ref 0–200)
HDL: 42.1 mg/dL (ref >39.00)
LDL Cholesterol: 131 mg/dL — ABNORMAL HIGH (ref 0–99)
NONHDL: 148.44
Total CHOL/HDL Ratio: 5
Triglycerides: 86 mg/dL (ref 0.0–149.0)
VLDL: 17.2 mg/dL (ref 0.0–40.0)

## 2015-10-13 LAB — HEPATIC FUNCTION PANEL
ALBUMIN: 4.4 g/dL (ref 3.5–5.2)
ALK PHOS: 82 U/L (ref 39–117)
ALT: 12 U/L (ref 0–53)
AST: 13 U/L (ref 0–37)
Bilirubin, Direct: 0.1 mg/dL (ref 0.0–0.3)
TOTAL PROTEIN: 6.6 g/dL (ref 6.0–8.3)
Total Bilirubin: 0.5 mg/dL (ref 0.2–1.2)

## 2015-10-13 LAB — TSH: TSH: 1.25 u[IU]/mL (ref 0.35–4.50)

## 2015-10-19 ENCOUNTER — Encounter: Payer: Self-pay | Admitting: Family Medicine

## 2015-10-19 ENCOUNTER — Ambulatory Visit (INDEPENDENT_AMBULATORY_CARE_PROVIDER_SITE_OTHER): Payer: 59 | Admitting: Family Medicine

## 2015-10-19 VITALS — BP 124/78 | HR 66 | Temp 97.9°F | Ht 68.5 in | Wt 172.7 lb

## 2015-10-19 DIAGNOSIS — Z0000 Encounter for general adult medical examination without abnormal findings: Secondary | ICD-10-CM

## 2015-10-19 NOTE — Progress Notes (Signed)
Pre visit review using our clinic review tool, if applicable. No additional management support is needed unless otherwise documented below in the visit note. 

## 2015-10-19 NOTE — Progress Notes (Signed)
Subjective:     Patient ID: Manuel Page, male   DOB: 07-Jul-1970, 45 y.o.   MRN: [id number]  HPI Patient here for physical exam. No chronic medical problems. Long history of smoking. He has transitioned to vapors during the past couple weeks and his long-range goal is to taper off nicotine completely over the next year or so. Takes no medications. Tetanus up-to-date. Plans to get flu vaccine through work. Declines Pneumovax  Family history and social history reviewed with no changes:  Past Medical History:  Diagnosis Date  . Chronic kidney disease    stones  . Prostate infection    No past surgical history on file.  reports that he has been smoking Cigarettes.  He has a 20.00 pack-year smoking history. He does not have any smokeless tobacco history on file. His alcohol and drug histories are not on file. family history includes Hypertension in his father. Allergies  Allergen Reactions  . Sulfa Drugs Cross Reactors     Eye swelling     Review of Systems  Constitutional: Negative for activity change, appetite change, fatigue and fever.  HENT: Positive for congestion. Negative for ear pain and trouble swallowing.   Eyes: Negative for pain and visual disturbance.  Respiratory: Negative for cough, shortness of breath and wheezing.   Cardiovascular: Negative for chest pain and palpitations.  Gastrointestinal: Negative for abdominal distention, abdominal pain, blood in stool, constipation, diarrhea, nausea, rectal pain and vomiting.  Genitourinary: Negative for dysuria, hematuria and testicular pain.  Musculoskeletal: Negative for arthralgias and joint swelling.  Skin: Negative for rash.  Neurological: Negative for dizziness, syncope and headaches.  Hematological: Negative for adenopathy.  Psychiatric/Behavioral: Negative for confusion and dysphoric mood.       Objective:   Physical Exam  Constitutional: He is oriented to person, place, and time. He appears well-developed  and well-nourished. No distress.  HENT:  Head: Normocephalic and atraumatic.  Right Ear: External ear normal.  Left Ear: External ear normal.  Mouth/Throat: Oropharynx is clear and moist.  Minimal cerumen right canal  Eyes: Conjunctivae and EOM are normal. Pupils are equal, round, and reactive to light.  Neck: Normal range of motion. Neck supple. No thyromegaly present.  Cardiovascular: Normal rate, regular rhythm and normal heart sounds.   No murmur heard. Pulmonary/Chest: No respiratory distress. He has no wheezes. He has no rales.  Abdominal: Soft. Bowel sounds are normal. He exhibits no distension and no mass. There is no tenderness. There is no rebound and no guarding.  Musculoskeletal: He exhibits no edema.  Lymphadenopathy:    He has no cervical adenopathy.  Neurological: He is alert and oriented to person, place, and time. He displays normal reflexes. No cranial nerve deficit.  Skin: No rash noted.  Psychiatric: He has a normal mood and affect.       Assessment:     Complete physical. Patient has transitioned to vapor inhalation of nicotine.    Plan:     -Labs reviewed with no major concerns -He plans to get flu vaccine through work -Taper off nicotine gradually  Eulas Post MD [facility]

## 2016-10-09 ENCOUNTER — Other Ambulatory Visit: Payer: 59

## 2016-10-19 ENCOUNTER — Encounter: Payer: 59 | Admitting: Family Medicine

## 2016-10-23 ENCOUNTER — Encounter: Payer: Self-pay | Admitting: Family Medicine

## 2016-10-23 ENCOUNTER — Ambulatory Visit (INDEPENDENT_AMBULATORY_CARE_PROVIDER_SITE_OTHER): Payer: 59 | Admitting: Family Medicine

## 2016-10-23 VITALS — BP 102/78 | HR 77 | Temp 98.1°F | Ht 68.75 in | Wt 171.7 lb

## 2016-10-23 DIAGNOSIS — Z0000 Encounter for general adult medical examination without abnormal findings: Secondary | ICD-10-CM | POA: Diagnosis not present

## 2016-10-23 LAB — LIPID PANEL
CHOLESTEROL: 171 mg/dL (ref 0–200)
HDL: 37.6 mg/dL — ABNORMAL LOW (ref >39.00)
LDL CALC: 118 mg/dL — AB (ref 0–99)
NonHDL: 133.57
TRIGLYCERIDES: 76 mg/dL (ref 0.0–149.0)
Total CHOL/HDL Ratio: 5
VLDL: 15.2 mg/dL (ref 0.0–40.0)

## 2016-10-23 LAB — BASIC METABOLIC PANEL
BUN: 10 mg/dL (ref 6–23)
CALCIUM: 9.5 mg/dL (ref 8.4–10.5)
CO2: 30 mEq/L (ref 19–32)
CREATININE: 0.74 mg/dL (ref 0.40–1.50)
Chloride: 106 mEq/L (ref 96–112)
GFR: 120.72 mL/min (ref >60.00)
Glucose, Bld: 91 mg/dL (ref 70–99)
Potassium: 4 mEq/L (ref 3.5–5.1)
Sodium: 140 mEq/L (ref 135–145)

## 2016-10-23 LAB — HEPATIC FUNCTION PANEL
ALT: 12 U/L (ref 0–53)
AST: 12 U/L (ref 0–37)
Albumin: 4.5 g/dL (ref 3.5–5.2)
Alkaline Phosphatase: 73 U/L (ref 39–117)
BILIRUBIN TOTAL: 0.6 mg/dL (ref 0.2–1.2)
Bilirubin, Direct: 0.1 mg/dL (ref 0.0–0.3)
Total Protein: 6.5 g/dL (ref 6.0–8.3)

## 2016-10-23 LAB — CBC WITH DIFFERENTIAL/PLATELET
BASOS PCT: 0.6 % (ref 0.0–3.0)
Basophils Absolute: 0 10*3/uL (ref 0.0–0.1)
EOS ABS: 0.1 10*3/uL (ref 0.0–0.7)
Eosinophils Relative: 1 % (ref 0.0–5.0)
HCT: 42.6 % (ref 39.0–52.0)
Hemoglobin: 14.7 g/dL (ref 13.0–17.0)
LYMPHS ABS: 1.9 10*3/uL (ref 0.7–4.0)
Lymphocytes Relative: 27.3 % (ref 12.0–46.0)
MCHC: 34.4 g/dL (ref 30.0–36.0)
MCV: 98.4 fl (ref 78.0–100.0)
MONO ABS: 0.4 10*3/uL (ref 0.1–1.0)
Monocytes Relative: 5.7 % (ref 3.0–12.0)
NEUTROS ABS: 4.4 10*3/uL (ref 1.4–7.7)
Neutrophils Relative %: 65.4 % (ref 43.0–77.0)
PLATELETS: 232 10*3/uL (ref 150.0–400.0)
RBC: 4.32 Mil/uL (ref 4.22–5.81)
RDW: 12.7 % (ref 11.5–15.5)
WBC: 6.8 10*3/uL (ref 4.0–10.5)

## 2016-10-23 LAB — TSH: TSH: 0.9 u[IU]/mL (ref 0.35–4.50)

## 2016-10-23 NOTE — Progress Notes (Signed)
Subjective:     Patient ID: Manuel Page, male   DOB: 1970/06/10, 46 y.o.   MRN: [id number]  HPI Patient seen for physical exam. He quit smoking last winter but unfortunately has resumed again currently about one pack per day. He hopes to stop again this winter. He takes no regular medications. No chronic medical problems otherwise. No changes in family history. No regular alcohol use. Denies any chest pain or other complaints. Tetanus up-to-date.  Past Medical History:  Diagnosis Date  . Chronic kidney disease    stones  . Prostate infection    No past surgical history on file.  reports that he has been smoking Cigarettes.  He has a 20.00 pack-year smoking history. He has never used smokeless tobacco. His alcohol and drug histories are not on file. family history includes Hypertension in his father. Allergies  Allergen Reactions  . Sulfa Drugs Cross Reactors     Eye swelling     Review of Systems  Constitutional: Negative for activity change, appetite change, fatigue and fever.  HENT: Negative for congestion, ear pain and trouble swallowing.   Eyes: Negative for pain and visual disturbance.  Respiratory: Negative for cough, shortness of breath and wheezing.   Cardiovascular: Negative for chest pain and palpitations.  Gastrointestinal: Negative for abdominal distention, abdominal pain, blood in stool, constipation, diarrhea, nausea, rectal pain and vomiting.  Genitourinary: Negative for dysuria, hematuria and testicular pain.  Musculoskeletal: Negative for arthralgias and joint swelling.  Skin: Negative for rash.  Neurological: Negative for dizziness, syncope and headaches.  Hematological: Negative for adenopathy.  Psychiatric/Behavioral: Negative for confusion and dysphoric mood.       Objective:   Physical Exam  Constitutional: He is oriented to person, place, and time. He appears well-developed and well-nourished. No distress.  HENT:  Head: Normocephalic and  atraumatic.  Right Ear: External ear normal.  Left Ear: External ear normal.  Mouth/Throat: Oropharynx is clear and moist.  Eyes: Pupils are equal, round, and reactive to light. Conjunctivae and EOM are normal.  Neck: Normal range of motion. Neck supple. No thyromegaly present.  Cardiovascular: Normal rate, regular rhythm and normal heart sounds.   No murmur heard. Pulmonary/Chest: No respiratory distress. He has no wheezes. He has no rales.  Abdominal: Soft. Bowel sounds are normal. He exhibits no distension and no mass. There is no tenderness. There is no rebound and no guarding.  Musculoskeletal: He exhibits no edema.  Lymphadenopathy:    He has no cervical adenopathy.  Neurological: He is alert and oriented to person, place, and time. He displays normal reflexes. No cranial nerve deficit.  Skin: No rash noted.  Psychiatric: He has a normal mood and affect.       Assessment:     Physical exam. Ongoing nicotine use.    Plan:     -We discussed smoking cessation. He hopes to quit on his own. Last year he was able taper off by using vapes and gradually reducing his nicotine amounts. -We discussed screening labs and these were obtained today. -He plans to get flu vaccine through his work  Eulas Post MD [facility]

## 2016-10-26 ENCOUNTER — Emergency Department (HOSPITAL_COMMUNITY)
Admission: EM | Admit: 2016-10-26 | Discharge: 2016-10-26 | Disposition: A | Discharge status: Home / Self Care | Payer: 59 | Attending: Emergency Medicine | Admitting: Emergency Medicine

## 2016-10-26 ENCOUNTER — Encounter (HOSPITAL_COMMUNITY): Payer: Self-pay | Admitting: Emergency Medicine

## 2016-10-26 ENCOUNTER — Emergency Department (HOSPITAL_COMMUNITY): Payer: 59

## 2016-10-26 DIAGNOSIS — R079 Chest pain, unspecified: Secondary | ICD-10-CM | POA: Diagnosis present

## 2016-10-26 DIAGNOSIS — F17210 Nicotine dependence, cigarettes, uncomplicated: Secondary | ICD-10-CM | POA: Diagnosis not present

## 2016-10-26 DIAGNOSIS — K2900 Acute gastritis without bleeding: Secondary | ICD-10-CM | POA: Diagnosis not present

## 2016-10-26 DIAGNOSIS — N189 Chronic kidney disease, unspecified: Secondary | ICD-10-CM | POA: Insufficient documentation

## 2016-10-26 LAB — I-STAT TROPONIN, ED
TROPONIN I, POC: 0 ng/mL (ref 0.00–0.08)
Troponin i, poc: 0 ng/mL (ref 0.00–0.08)

## 2016-10-26 LAB — BASIC METABOLIC PANEL
Anion gap: 7 (ref 5–15)
BUN: 10 mg/dL (ref 6–20)
CHLORIDE: 107 mmol/L (ref 101–111)
CO2: 24 mmol/L (ref 22–32)
Calcium: 9.3 mg/dL (ref 8.9–10.3)
Creatinine, Ser: 0.77 mg/dL (ref 0.61–1.24)
Glucose, Bld: 102 mg/dL — ABNORMAL HIGH (ref 65–99)
POTASSIUM: 3.7 mmol/L (ref 3.5–5.1)
SODIUM: 138 mmol/L (ref 135–145)

## 2016-10-26 LAB — CBC
HEMATOCRIT: 41.1 % (ref 39.0–52.0)
Hemoglobin: 14.8 g/dL (ref 13.0–17.0)
MCH: 34.1 pg — ABNORMAL HIGH (ref 26.0–34.0)
MCHC: 36 g/dL (ref 30.0–36.0)
MCV: 94.7 fL (ref 78.0–100.0)
PLATELETS: 203 10*3/uL (ref 150–400)
RBC: 4.34 MIL/uL (ref 4.22–5.81)
RDW: 12 % (ref 11.5–15.5)
WBC: 13.3 10*3/uL — AB (ref 4.0–10.5)

## 2016-10-26 MED ORDER — GI COCKTAIL ~~LOC~~
30.0000 mL | Freq: Once | ORAL | Status: AC
  Administered 2016-10-26: 30 mL via ORAL
  Filled 2016-10-26: qty 30

## 2016-10-26 MED ORDER — PANTOPRAZOLE SODIUM 20 MG PO TBEC: 20.0000 mg | DELAYED_RELEASE_TABLET | Freq: Two times a day (BID) | ORAL | 5 refills | Status: DC

## 2016-10-26 NOTE — ED Triage Notes (Signed)
Pt reports central CP, non radiating. Started last night after eating and worsened when lying down to sleep. Currently no pain but it is tight/burning in nature.

## 2016-10-26 NOTE — ED Provider Notes (Signed)
[facility] DEPT Provider Note   CSN: [id number] Arrival date & time: 10/26/16  6213     History   Chief Complaint Chief Complaint  Patient presents with  . Chest Pain    HPI Manuel Page is a 46 y.o. male. with past medical history of chronic kidney disease, hx of smoking, and prostate infection.  He developed central chest pain last night that did not radiate, his pain started after eating and then worsened when he laid down for sleep. He was able to sleep through the night. HE eats friend food occasional, likes spicy foods, drinks a lot of caffeine, uses NSAIDS.  He is no longer having any chest pain but is experiencing some burning sensations. He denied having any syncope, lower extremity swelling, SOB, exertional chest pain, palpitations, or back pain.  EKG is normal sinus rhythm, negative troponin, CBC and CMP are WNL.   HPI   Past Medical History:  Diagnosis Date  . Chronic kidney disease    stones  . Prostate infection     There are no active problems to display for this patient.   History reviewed. No pertinent surgical history.     Home Medications    Prior to Admission medications   Medication Sig Start Date End Date Taking? Authorizing Provider  EPINEPHrine (EPIPEN 2-PAK) 0.3 mg/0.3 mL IJ SOAJ injection Inject 0.3 mLs (0.3 mg total) into the muscle once. 10/19/13   Burchette, Alinda Sierras, MD  pantoprazole (PROTONIX) 20 MG tablet Take 1 tablet (20 mg total) by mouth 2 (two) times daily before a meal. 10/26/16   Delos Haring, PA-C    Family History Family History  Problem Relation Age of Onset  . Hypertension Father     Social History Social History  Substance Use Topics  . Smoking status: Current Every Day Smoker    Packs/day: 1.00    Years: 20.00    Types: Cigarettes  . Smokeless tobacco: Never Used  . Alcohol use No     Allergies   Sulfa drugs cross reactors   Review of Systems Review of Systems The patient denies anorexia,  fever, weight loss, vision loss, decreased hearing, hoarseness, syncope, dyspnea on exertion, peripheral edema, balance deficits, hemoptysis, abdominal pain, melena, hematochezia, severe indigestion/heartburn, hematuria, incontinence, genital sores, muscle weakness, suspicious skin lesions, transient blindness, difficulty walking, depression, unusual weight change, abnormal bleeding, enlarged lymph nodes, angioedema, and breast masses.   Physical Exam Updated Vital Signs BP 114/89   Pulse 60   Temp 98.1 F (36.7 C) (Oral)   Resp (!) 21   Ht 5\' 8"  (1.727 m)   Wt 77.1 kg (170 lb)   SpO2 98%   BMI 25.85 kg/m   Physical Exam  Constitutional: He appears well-developed and well-nourished. No distress.  HENT:  Head: Normocephalic and atraumatic.  Right Ear: Tympanic membrane and ear canal normal.  Left Ear: Tympanic membrane and ear canal normal.  Nose: Nose normal.  Mouth/Throat: Uvula is midline, oropharynx is clear and moist and mucous membranes are normal.  Eyes: Pupils are equal, round, and reactive to light.  Neck: Normal range of motion. Neck supple.  Cardiovascular: Normal rate and regular rhythm.   Pulmonary/Chest: Effort normal.  Abdominal: Soft.  No signs of abdominal distention  Musculoskeletal:  No LE swelling  Neurological: He is alert.  Acting at baseline  Skin: Skin is warm and dry. No rash noted.  Nursing note and vitals reviewed.    ED Treatments / Results  Labs (all labs  ordered are listed, but only abnormal results are displayed) Labs Reviewed  BASIC METABOLIC PANEL - Abnormal; Notable for the following:       Result Value   Glucose, Bld 102 (*)    All other components within normal limits  CBC - Abnormal; Notable for the following:    WBC 13.3 (*)    MCH 34.1 (*)    All other components within normal limits  I-STAT TROPONIN, ED  I-STAT TROPONIN, ED    EKG  EKG Interpretation  Date/Time:  Friday October 26 2016 06:48:20 EDT Ventricular Rate:   70 PR Interval:  124 QRS Duration: 90 QT Interval:  368 QTC Calculation: 397 R Axis:   78 Text Interpretation:  Normal sinus rhythm Normal ECG No previous ECGs available Confirmed by Gareth Morgan [phone]) on 10/26/2016 9:12:17 AM       Radiology Dg Chest 2 View  Result Date: 10/26/2016 CLINICAL DATA:  Midline chest pain. EXAM: CHEST  2 VIEW COMPARISON:  None. FINDINGS: Borderline bronchitic markings. There is no edema, consolidation, effusion, or pneumothorax. Normal heart size and mediastinal contours. IMPRESSION: No acute finding. Electronically Signed   By: Monte Fantasia M.D.   On: 10/26/2016 07:36    Procedures Procedures (including critical care time)  Medications Ordered in ED Medications  gi cocktail (Maalox,Lidocaine,Donnatal) (30 mLs Oral Given 10/26/16 3845)     Initial Impression / Assessment and Plan / ED Course  I have reviewed the triage vital signs and the nursing notes.  Pertinent labs & imaging results that were available during my care of the patient were reviewed by me and considered in my medical decision making (see chart for details).   Patients symptoms are consistent with gastritis, he admits to having a bag of hot fries and fried spicy peppers a few days ago. He has not had anymore pain at rest but when he eats something he can feel it to the center of his check. It improved with the GI cocktail He was able to eat two crackers and drink clear fluids.  Will check a delta troponin, his initial is 0.00. If this is negative will start him on Protonix and discuss dietary guidelines and what to avoid for acid reflux.  Patients second troponin is negative. He has been eating and drinking with only mild discomfort. No pain if he is not eating or drinking. Willr refer back to PCP and he may ultimately require GI for endoscopy.  Final Clinical Impressions(s) / ED Diagnoses   Final diagnoses:  Acute gastritis without hemorrhage, unspecified gastritis type     New Prescriptions Discharge Medication List as of 10/26/2016  9:47 AM    START taking these medications   Details  pantoprazole (PROTONIX) 20 MG tablet Take 1 tablet (20 mg total) by mouth 2 (two) times daily before a meal., Starting Fri 10/26/2016, Print         Carlota Raspberry, Adamarys Shall, PA-C 10/26/16 1519    Gareth Morgan, MD 10/30/16 0028

## 2016-11-13 ENCOUNTER — Encounter: Payer: Self-pay | Admitting: Family Medicine

## 2016-11-15 ENCOUNTER — Encounter: Payer: Self-pay | Admitting: Family Medicine

## 2017-10-25 ENCOUNTER — Encounter: Payer: 59 | Admitting: Family Medicine

## 2017-11-01 ENCOUNTER — Ambulatory Visit (INDEPENDENT_AMBULATORY_CARE_PROVIDER_SITE_OTHER): Payer: 59 | Admitting: Family Medicine

## 2017-11-01 ENCOUNTER — Encounter: Payer: Self-pay | Admitting: Family Medicine

## 2017-11-01 VITALS — BP 110/70 | HR 71 | Temp 97.9°F | Ht 69.0 in | Wt 170.3 lb

## 2017-11-01 DIAGNOSIS — Z0000 Encounter for general adult medical examination without abnormal findings: Secondary | ICD-10-CM

## 2017-11-01 LAB — CBC WITH DIFFERENTIAL/PLATELET
BASOS ABS: 0 10*3/uL (ref 0.0–0.1)
BASOS PCT: 0.8 % (ref 0.0–3.0)
Eosinophils Absolute: 0.1 10*3/uL (ref 0.0–0.7)
Eosinophils Relative: 2.2 % (ref 0.0–5.0)
HCT: 43.5 % (ref 39.0–52.0)
HEMOGLOBIN: 15.2 g/dL (ref 13.0–17.0)
Lymphocytes Relative: 31.1 % (ref 12.0–46.0)
Lymphs Abs: 1.8 10*3/uL (ref 0.7–4.0)
MCHC: 34.9 g/dL (ref 30.0–36.0)
MCV: 97.5 fl (ref 78.0–100.0)
MONO ABS: 0.3 10*3/uL (ref 0.1–1.0)
Monocytes Relative: 5.6 % (ref 3.0–12.0)
NEUTROS ABS: 3.5 10*3/uL (ref 1.4–7.7)
Neutrophils Relative %: 60.3 % (ref 43.0–77.0)
PLATELETS: 228 10*3/uL (ref 150.0–400.0)
RBC: 4.47 Mil/uL (ref 4.22–5.81)
RDW: 12.1 % (ref 11.5–15.5)
WBC: 5.9 10*3/uL (ref 4.0–10.5)

## 2017-11-01 LAB — LIPID PANEL
CHOL/HDL RATIO: 5
CHOLESTEROL: 194 mg/dL (ref 0–200)
HDL: 38.9 mg/dL — ABNORMAL LOW (ref >39.00)
LDL CALC: 135 mg/dL — AB (ref 0–99)
NonHDL: 155.56
Triglycerides: 101 mg/dL (ref 0.0–149.0)
VLDL: 20.2 mg/dL (ref 0.0–40.0)

## 2017-11-01 LAB — BASIC METABOLIC PANEL
BUN: 16 mg/dL (ref 6–23)
CHLORIDE: 107 meq/L (ref 96–112)
CO2: 29 meq/L (ref 19–32)
CREATININE: 0.93 mg/dL (ref 0.40–1.50)
Calcium: 9.5 mg/dL (ref 8.4–10.5)
GFR: 92.33 mL/min (ref >60.00)
Glucose, Bld: 82 mg/dL (ref 70–99)
POTASSIUM: 4.2 meq/L (ref 3.5–5.1)
Sodium: 142 mEq/L (ref 135–145)

## 2017-11-01 LAB — HEPATIC FUNCTION PANEL
ALT: 12 U/L (ref 0–53)
AST: 11 U/L (ref 0–37)
Albumin: 4.6 g/dL (ref 3.5–5.2)
Alkaline Phosphatase: 77 U/L (ref 39–117)
BILIRUBIN DIRECT: 0.1 mg/dL (ref 0.0–0.3)
Total Bilirubin: 0.5 mg/dL (ref 0.2–1.2)
Total Protein: 6.6 g/dL (ref 6.0–8.3)

## 2017-11-01 LAB — TSH: TSH: 1.45 u[IU]/mL (ref 0.35–4.50)

## 2017-11-01 NOTE — Progress Notes (Signed)
Subjective:     Patient ID: Manuel Page, male   DOB: 1970-09-07, 47 y.o.   MRN: [id number]  HPI Patient here for physical. Still smokes 1 pack of cigarettes per day. No regular medications. He has no specific complaints at this time. His tetanus is up-to-date. Plan is to get flu vaccine through work. He has history of low HDL cholesterol. No family history of premature CAD. No family history of cancer. No consistent exercise.  Past Medical History:  Diagnosis Date  . Chronic kidney disease    stones  . Prostate infection    No past surgical history on file.  reports that he has been smoking cigarettes. He has a 20.00 pack-year smoking history. He has never used smokeless tobacco. He reports that he does not drink alcohol or use drugs. family history includes Hypertension in his father. Allergies  Allergen Reactions  . Sulfa Drugs Cross Reactors     Eye swelling     Review of Systems  Constitutional: Negative for activity change, appetite change, fatigue and fever.  HENT: Negative for congestion, ear pain and trouble swallowing.   Eyes: Negative for pain and visual disturbance.  Respiratory: Negative for cough, shortness of breath and wheezing.   Cardiovascular: Negative for chest pain and palpitations.  Gastrointestinal: Negative for abdominal distention, abdominal pain, blood in stool, constipation, diarrhea, nausea, rectal pain and vomiting.  Genitourinary: Negative for dysuria, hematuria and testicular pain.  Musculoskeletal: Negative for arthralgias and joint swelling.  Skin: Negative for rash.  Neurological: Negative for dizziness, syncope and headaches.  Hematological: Negative for adenopathy.  Psychiatric/Behavioral: Negative for confusion and dysphoric mood.       Objective:   Physical Exam  Constitutional: He is oriented to person, place, and time. He appears well-developed and well-nourished. No distress.  HENT:  Head: Normocephalic and atraumatic.  Right  Ear: External ear normal.  Left Ear: External ear normal.  Mouth/Throat: Oropharynx is clear and moist.  Eyes: Pupils are equal, round, and reactive to light. Conjunctivae and EOM are normal.  Neck: Normal range of motion. Neck supple. No thyromegaly present.  Cardiovascular: Normal rate, regular rhythm and normal heart sounds.  No murmur heard. Pulmonary/Chest: No respiratory distress. He has no wheezes. He has no rales.  Abdominal: Soft. Bowel sounds are normal. He exhibits no distension and no mass. There is no tenderness. There is no rebound and no guarding.  Musculoskeletal: He exhibits no edema.  Lymphadenopathy:    He has no cervical adenopathy.  Neurological: He is alert and oriented to person, place, and time. He displays normal reflexes. No cranial nerve deficit.  Skin: No rash noted.  Psychiatric: He has a normal mood and affect.       Assessment:     Physical exam. Still smokes one pack cigarettes per day. No other chronic medical problems    Plan:     -smoking cessation discussed with handout given -Obtain screening lab work -He will get flu vaccine through his work  Kristian Covey MD [facility]

## 2017-11-01 NOTE — Patient Instructions (Signed)

## 2017-11-04 ENCOUNTER — Encounter: Payer: Self-pay | Admitting: Family Medicine

## 2018-06-30 IMAGING — DX DG CHEST 2V
2 series · 2 of 2 positions shown · non-contrast
Comparison: None.

CLINICAL DATA: Midline chest pain.

EXAM:
CHEST  2 VIEW

[chest pa]
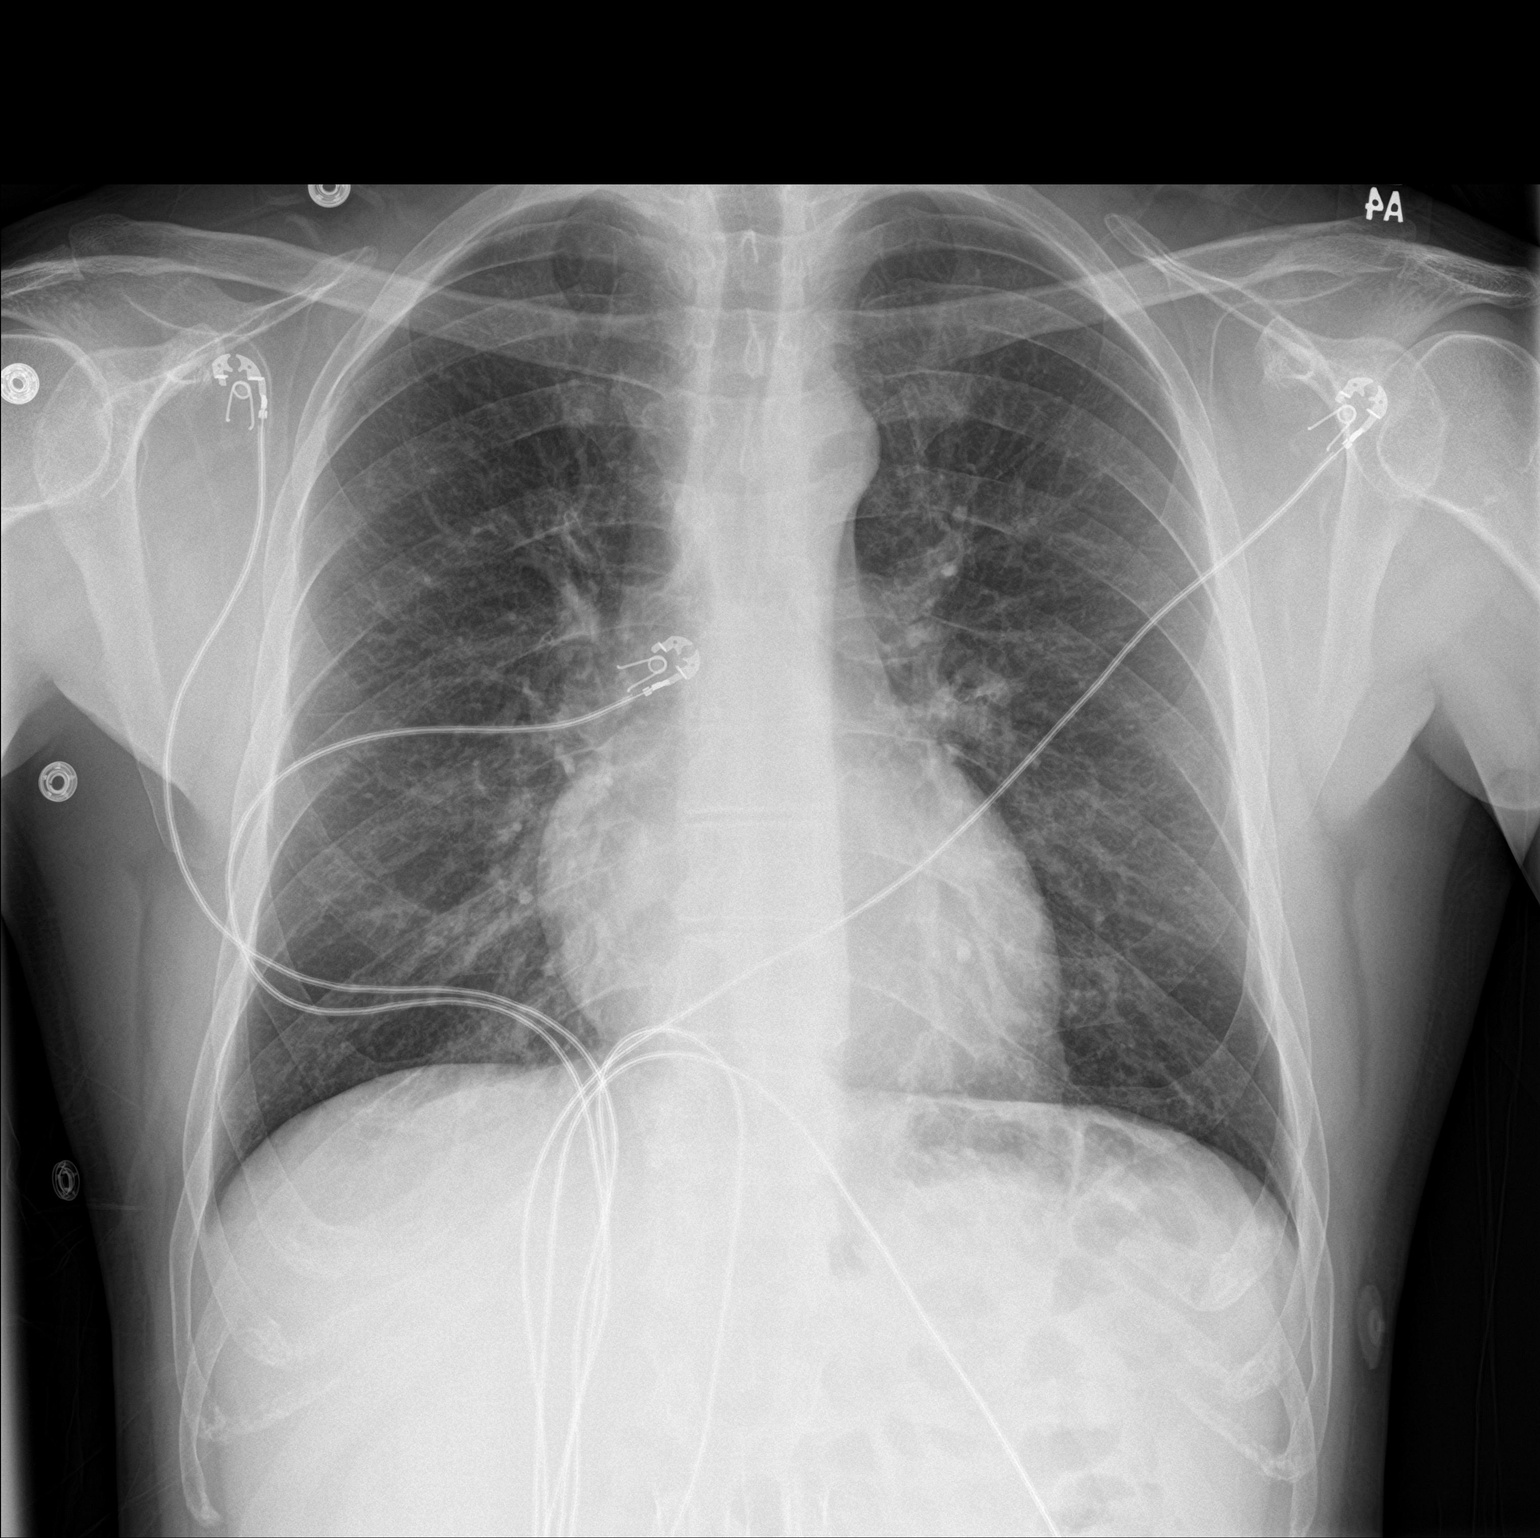

[chest lat]
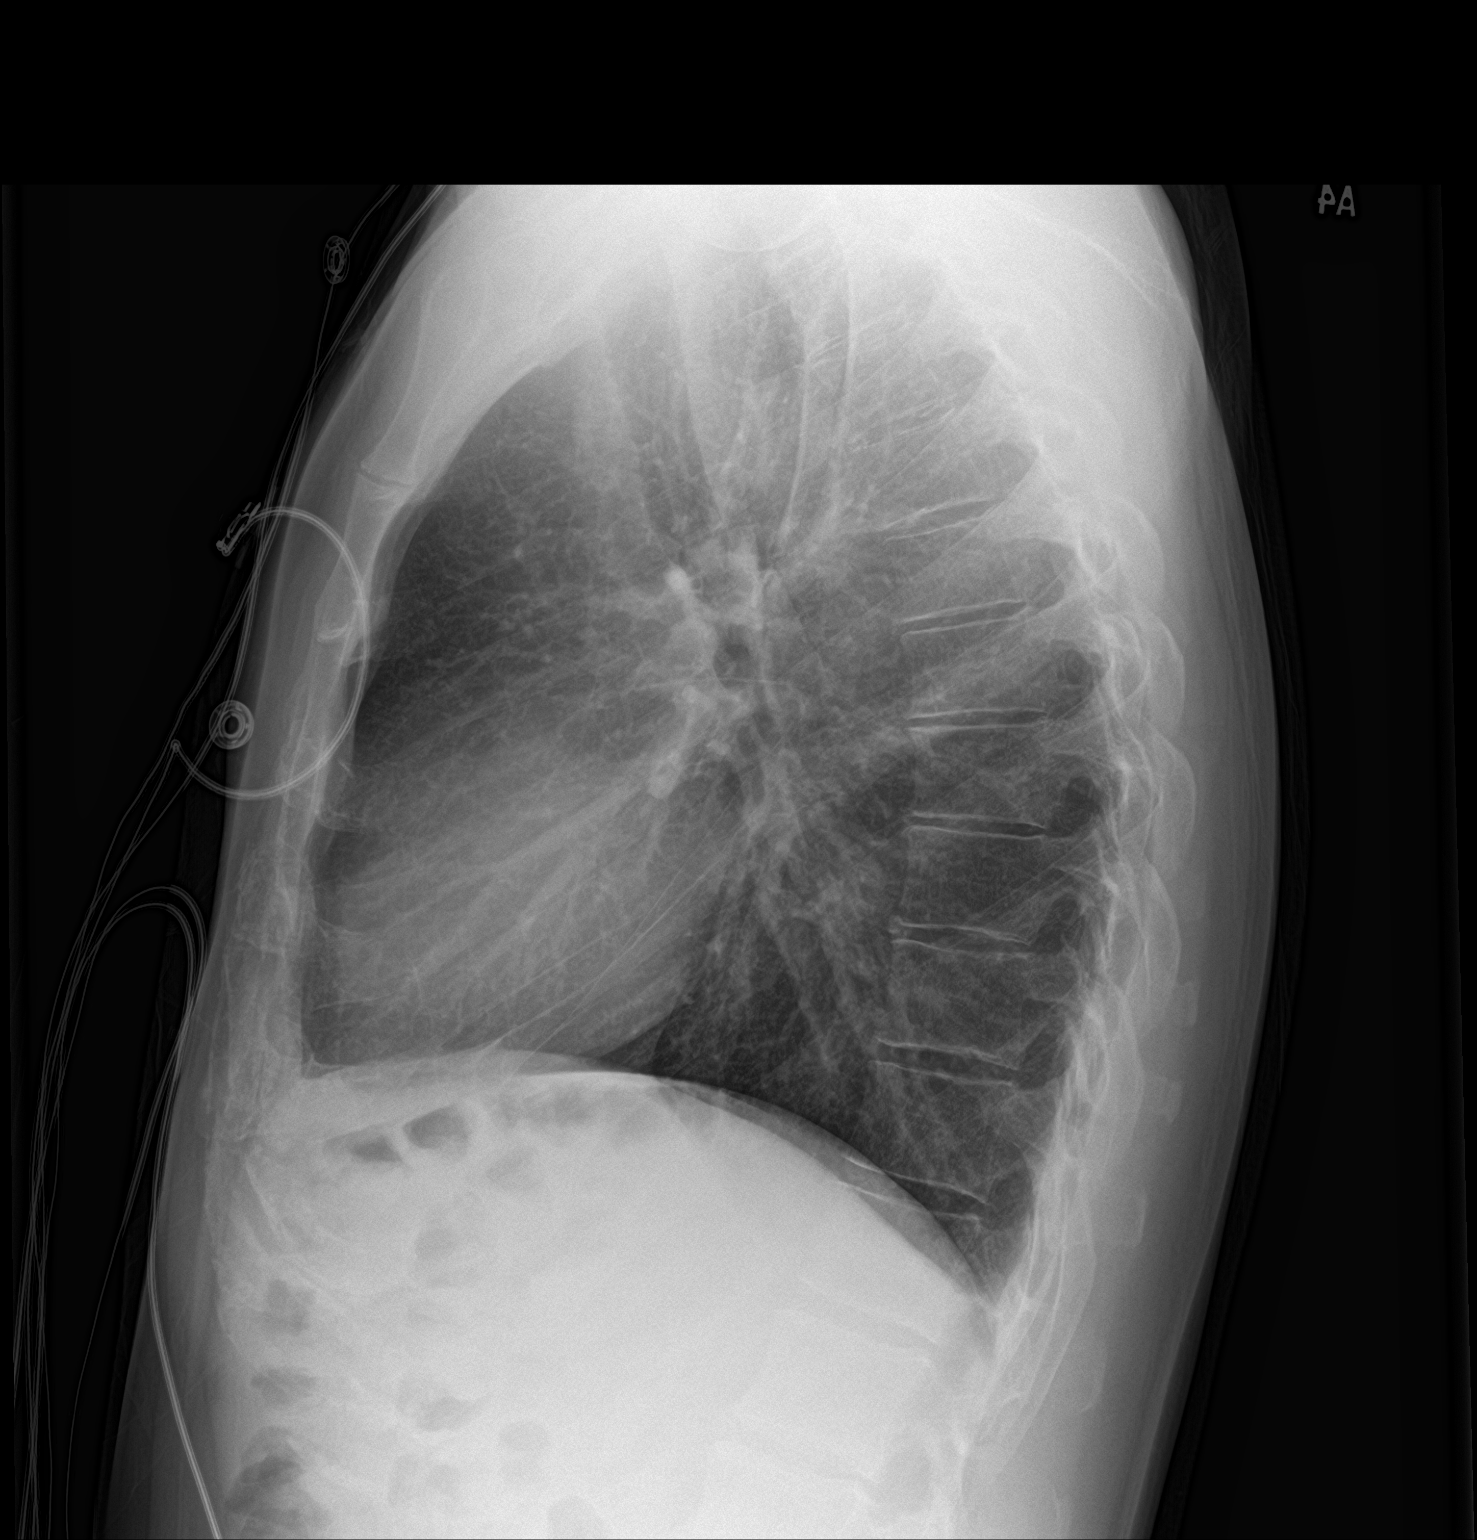

[2 of 2 positions shown; findings below may reference images not displayed]

FINDINGS: Borderline bronchitic markings. There is no edema, consolidation,
effusion, or pneumothorax. Normal heart size and mediastinal
contours.
IMPRESSION: No acute finding.

## 2018-12-01 ENCOUNTER — Encounter: Payer: Self-pay | Admitting: Family Medicine

## 2018-12-01 ENCOUNTER — Ambulatory Visit (INDEPENDENT_AMBULATORY_CARE_PROVIDER_SITE_OTHER): Payer: 59 | Admitting: Family Medicine

## 2018-12-01 ENCOUNTER — Other Ambulatory Visit: Payer: Self-pay

## 2018-12-01 VITALS — BP 110/60 | HR 72 | Temp 97.0°F | Wt 173.5 lb

## 2018-12-01 DIAGNOSIS — Z0000 Encounter for general adult medical examination without abnormal findings: Secondary | ICD-10-CM | POA: Diagnosis not present

## 2018-12-01 DIAGNOSIS — Z23 Encounter for immunization: Secondary | ICD-10-CM | POA: Diagnosis not present

## 2018-12-01 LAB — BASIC METABOLIC PANEL
BUN: 13 mg/dL (ref 6–23)
CO2: 27 mEq/L (ref 19–32)
Calcium: 9.1 mg/dL (ref 8.4–10.5)
Chloride: 105 mEq/L (ref 96–112)
Creatinine, Ser: 0.86 mg/dL (ref 0.40–1.50)
GFR: 94.65 mL/min (ref >60.00)
Glucose, Bld: 86 mg/dL (ref 70–99)
Potassium: 4.2 mEq/L (ref 3.5–5.1)
Sodium: 139 mEq/L (ref 135–145)

## 2018-12-01 LAB — CBC WITH DIFFERENTIAL/PLATELET
Basophils Absolute: 0.1 10*3/uL (ref 0.0–0.1)
Basophils Relative: 1 % (ref 0.0–3.0)
Eosinophils Absolute: 0.1 10*3/uL (ref 0.0–0.7)
Eosinophils Relative: 2.1 % (ref 0.0–5.0)
HCT: 42.8 % (ref 39.0–52.0)
Hemoglobin: 15 g/dL (ref 13.0–17.0)
Lymphocytes Relative: 28.5 % (ref 12.0–46.0)
Lymphs Abs: 1.8 10*3/uL (ref 0.7–4.0)
MCHC: 35 g/dL (ref 30.0–36.0)
MCV: 99.7 fl (ref 78.0–100.0)
Monocytes Absolute: 0.4 10*3/uL (ref 0.1–1.0)
Monocytes Relative: 5.9 % (ref 3.0–12.0)
Neutro Abs: 3.9 10*3/uL (ref 1.4–7.7)
Neutrophils Relative %: 62.5 % (ref 43.0–77.0)
Platelets: 220 10*3/uL (ref 150.0–400.0)
RBC: 4.29 Mil/uL (ref 4.22–5.81)
RDW: 12.4 % (ref 11.5–15.5)
WBC: 6.2 10*3/uL (ref 4.0–10.5)

## 2018-12-01 LAB — HEPATIC FUNCTION PANEL
ALT: 12 U/L (ref 0–53)
AST: 12 U/L (ref 0–37)
Albumin: 4.5 g/dL (ref 3.5–5.2)
Alkaline Phosphatase: 81 U/L (ref 39–117)
Bilirubin, Direct: 0.1 mg/dL (ref 0.0–0.3)
Total Bilirubin: 0.6 mg/dL (ref 0.2–1.2)
Total Protein: 6.4 g/dL (ref 6.0–8.3)

## 2018-12-01 LAB — TSH: TSH: 1.37 u[IU]/mL (ref 0.35–4.50)

## 2018-12-01 LAB — LIPID PANEL
Cholesterol: 185 mg/dL (ref 0–200)
HDL: 38.9 mg/dL — ABNORMAL LOW (ref >39.00)
LDL Cholesterol: 129 mg/dL — ABNORMAL HIGH (ref 0–99)
NonHDL: 145.68
Total CHOL/HDL Ratio: 5
Triglycerides: 81 mg/dL (ref 0.0–149.0)
VLDL: 16.2 mg/dL (ref 0.0–40.0)

## 2018-12-01 NOTE — Progress Notes (Signed)
Subjective:     Patient ID: Manuel Page, male   DOB: [birth year], 48 y.o.   MRN: [id number]  HPI   Reno is seen for physical exam.  He has no significant chronic medical problems.  Takes no regular medications.  Prior syncopal episode following bee sting but not clear if this was anaphylaxis.  He has EpiPen's at home.  He does not have any recent concerning symptoms.  He does request flu vaccine.  Tetanus is up-to-date.  Still smokes about 3/4 pack cigarettes per day.  Low motivation to quit currently.  He is contemplating this. He is married.  No children.  Works as Acupuncturist.  Masters in Public relations account executive from [employer]  Family history reviewed with no significant changes  Past Medical History:  Diagnosis Date  . Chronic kidney disease    stones  . Prostate infection    No past surgical history on file.  reports that he has been smoking cigarettes. He has a 20.00 pack-year smoking history. He has never used smokeless tobacco. He reports that he does not drink alcohol or use drugs. family history includes Hypertension in his father. Allergies  Allergen Reactions  . Sulfa Drugs Cross Reactors     Eye swelling     Review of Systems  Constitutional: Negative for activity change, appetite change, fatigue and fever.  HENT: Negative for congestion, ear pain and trouble swallowing.   Eyes: Negative for pain and visual disturbance.  Respiratory: Negative for cough, shortness of breath and wheezing.   Cardiovascular: Negative for chest pain and palpitations.  Gastrointestinal: Negative for abdominal distention, abdominal pain, blood in stool, constipation, diarrhea, nausea, rectal pain and vomiting.  Genitourinary: Negative for dysuria, hematuria and testicular pain.  Musculoskeletal: Negative for arthralgias and joint swelling.  Skin: Negative for rash.  Neurological: Negative for dizziness, syncope and headaches.  Hematological: Negative for adenopathy.   Psychiatric/Behavioral: Negative for confusion and dysphoric mood.       Objective:   Physical Exam Constitutional:      General: He is not in acute distress.    Appearance: He is well-developed.  HENT:     Head: Normocephalic and atraumatic.     Right Ear: External ear normal.     Left Ear: External ear normal.  Eyes:     Conjunctiva/sclera: Conjunctivae normal.     Pupils: Pupils are equal, round, and reactive to light.  Neck:     Musculoskeletal: Normal range of motion and neck supple.     Thyroid: No thyromegaly.  Cardiovascular:     Rate and Rhythm: Normal rate and regular rhythm.     Heart sounds: Normal heart sounds. No murmur.  Pulmonary:     Effort: No respiratory distress.     Breath sounds: No wheezing or rales.  Abdominal:     General: Bowel sounds are normal. There is no distension.     Palpations: Abdomen is soft. There is no mass.     Tenderness: There is no abdominal tenderness. There is no guarding or rebound.  Lymphadenopathy:     Cervical: No cervical adenopathy.  Skin:    Findings: No rash.  Neurological:     Mental Status: He is alert and oriented to person, place, and time.     Cranial Nerves: No cranial nerve deficit.        Assessment:     Physical exam.  Generally healthy 48 year old male.  History of ongoing nicotine use as above.    Plan:     -  Flu vaccine given -Obtain screening labs -Discussed smoking cessation.  We have offered any assistance if he decides to quit at some point this year  Kristian Covey MD [facility] at [facility]

## 2019-05-21 ENCOUNTER — Ambulatory Visit: Payer: 59 | Attending: Internal Medicine

## 2019-05-21 DIAGNOSIS — Z23 Encounter for immunization: Secondary | ICD-10-CM

## 2019-05-21 NOTE — Progress Notes (Signed)
Covid-19 Vaccination Clinic  Name:  SHAMONE LUCATERO    MRN: [id number] DOB: Jul 10, 1970  05/21/2019  Mr. Vititoe was observed post Covid-19 immunization for 15 minutes without incident. He was provided with Vaccine Information Sheet and instruction to access the V-Safe system.   Mr. Pound was instructed to call 911 with any severe reactions post vaccine: Marland Kitchen Difficulty breathing  . Swelling of face and throat  . A fast heartbeat  . A bad rash all over body  . Dizziness and weakness   Immunizations Administered    Name Date Dose VIS Date Route   Pfizer COVID-19 Vaccine 05/21/2019 12:32 PM 0.3 mL 02/06/2019 Intramuscular   Manufacturer: [employer], [employer]   Lot: [id number]   NDC: [address]-9

## 2019-06-15 ENCOUNTER — Ambulatory Visit: Payer: 59 | Attending: Internal Medicine

## 2019-06-15 DIAGNOSIS — Z23 Encounter for immunization: Secondary | ICD-10-CM

## 2019-06-15 NOTE — Progress Notes (Signed)
Covid-19 Vaccination Clinic  Name:  CURTISS TRONE    MRN: [id number] DOB: Feb 23, 1971  06/15/2019  Mr. Bowersock was observed post Covid-19 immunization for 30 minutes based on pre-vaccination screening without incident. He was provided with Vaccine Information Sheet and instruction to access the V-Safe system.   Mr. Kosak was instructed to call 911 with any severe reactions post vaccine: Marland Kitchen Difficulty breathing  . Swelling of face and throat  . A fast heartbeat  . A bad rash all over body  . Dizziness and weakness   Immunizations Administered    Name Date Dose VIS Date Route   Pfizer COVID-19 Vaccine 06/15/2019 12:14 PM 0.3 mL 04/22/2018 Intramuscular   Manufacturer: [employer], [employer]   Lot: [id number]   NDC: [address]-6

## 2019-12-02 ENCOUNTER — Encounter: Payer: Self-pay | Admitting: Family Medicine

## 2019-12-02 ENCOUNTER — Other Ambulatory Visit: Payer: Self-pay

## 2019-12-02 ENCOUNTER — Ambulatory Visit (INDEPENDENT_AMBULATORY_CARE_PROVIDER_SITE_OTHER): Payer: 59 | Admitting: Family Medicine

## 2019-12-02 VITALS — BP 110/70 | HR 74 | Temp 97.8°F | Ht 67.6 in | Wt 171.7 lb

## 2019-12-02 DIAGNOSIS — Z23 Encounter for immunization: Secondary | ICD-10-CM | POA: Diagnosis not present

## 2019-12-02 DIAGNOSIS — Z0000 Encounter for general adult medical examination without abnormal findings: Secondary | ICD-10-CM

## 2019-12-02 NOTE — Progress Notes (Signed)
Established Patient Office Visit  Subjective:  Patient ID: Manuel Page, male    DOB: Mar 15, 1970  Age: 49 y.o. MRN: [id number]  CC:  Chief Complaint  Patient presents with  . Annual Exam    Doing well    HPI Manuel Page presents for physical exam.  Ongoing nicotine use.  Otherwise healthy.  Takes no regular medications.  He does have history of kidney stones and had another kidney stone this past year.  He thinks these were calcium oxalate stones.  Past medical history.  As above.  Takes no regular medications.  History of questionable anaphylaxis with bee stings.  He has EpiPen for that.  No prior surgeries.  Social history--- married.  No children.  Works as Engineer, production.  Smokes about half pack cigarettes per day.  No regular alcohol use.  Family history-Father passed away this year.  Question of hepatic cancer but not certain.  He apparently had some type of obstructive problem with the liver.  He has 2 sisters.  They are generally in good health except for one has obesity.  No family history of premature CAD.  Health maintenance -Needs flu vaccine -Covid vaccine already given -No history of hepatitis C screening but low risk -We discussed colonoscopy but he declines until age 81 -Tetanus up-to-date  Past Medical History:  Diagnosis Date  . Chronic kidney disease    stones  . Prostate infection     History reviewed. No pertinent surgical history.  Family History  Problem Relation Age of Onset  . Hypertension Father   . Cancer Father        ?hepatic cancer    Social History   Socioeconomic History  . Marital status: Married    Spouse name: Not on file  . Number of children: Not on file  . Years of education: Not on file  . Highest education level: Not on file  Occupational History  . Not on file  Tobacco Use  . Smoking status: Current Every Day Smoker    Packs/day: 1.00    Years: 20.00    Pack years: 20.00    Types:  Cigarettes  . Smokeless tobacco: Never Used  Vaping Use  . Vaping Use: Never used  Substance and Sexual Activity  . Alcohol use: No  . Drug use: No  . Sexual activity: Not on file  Other Topics Concern  . Not on file  Social History Narrative  . Not on file   Social Determinants of Health   Financial Resource Strain:   . Difficulty of Paying Living Expenses: Not on file  Food Insecurity:   . Worried About Programme researcher, broadcasting/film/video in the Last Year: Not on file  . Ran Out of Food in the Last Year: Not on file  Transportation Needs:   . Lack of Transportation (Medical): Not on file  . Lack of Transportation (Non-Medical): Not on file  Physical Activity:   . Days of Exercise per Week: Not on file  . Minutes of Exercise per Session: Not on file  Stress:   . Feeling of Stress : Not on file  Social Connections:   . Frequency of Communication with Friends and Family: Not on file  . Frequency of Social Gatherings with Friends and Family: Not on file  . Attends Religious Services: Not on file  . Active Member of Clubs or Organizations: Not on file  . Attends Banker Meetings: Not on file  .  Marital Status: Not on file  Intimate Partner Violence:   . Fear of Current or Ex-Partner: Not on file  . Emotionally Abused: Not on file  . Physically Abused: Not on file  . Sexually Abused: Not on file    Outpatient Medications Prior to Visit  Medication Sig Dispense Refill  . EPINEPHrine (EPIPEN 2-PAK) 0.3 mg/0.3 mL IJ SOAJ injection Inject 0.3 mLs (0.3 mg total) into the muscle once. 2 Device 0   No facility-administered medications prior to visit.    Allergies  Allergen Reactions  . Other Swelling  . Sulfa Drugs Cross Reactors     Eye swelling    ROS Review of Systems  Constitutional: Negative for activity change, appetite change, fatigue and fever.  HENT: Negative for congestion, ear pain and trouble swallowing.   Eyes: Negative for pain and visual disturbance.    Respiratory: Negative for cough, shortness of breath and wheezing.   Cardiovascular: Negative for chest pain and palpitations.  Gastrointestinal: Negative for abdominal distention, abdominal pain, blood in stool, constipation, diarrhea, nausea, rectal pain and vomiting.  Endocrine: Negative for polydipsia and polyuria.  Genitourinary: Negative for dysuria, hematuria and testicular pain.  Musculoskeletal: Negative for arthralgias and joint swelling.  Skin: Negative for rash.  Neurological: Negative for dizziness, syncope and headaches.  Hematological: Negative for adenopathy.  Psychiatric/Behavioral: Negative for confusion and dysphoric mood.      Objective:    Physical Exam Constitutional:      General: He is not in acute distress.    Appearance: He is well-developed.  HENT:     Head: Normocephalic and atraumatic.     Right Ear: External ear normal.     Left Ear: External ear normal.  Eyes:     Conjunctiva/sclera: Conjunctivae normal.     Pupils: Pupils are equal, round, and reactive to light.  Neck:     Thyroid: No thyromegaly.  Cardiovascular:     Rate and Rhythm: Normal rate and regular rhythm.     Heart sounds: Normal heart sounds. No murmur heard.   Pulmonary:     Effort: No respiratory distress.     Breath sounds: No wheezing or rales.  Abdominal:     General: Bowel sounds are normal. There is no distension.     Palpations: Abdomen is soft. There is no mass.     Tenderness: There is no abdominal tenderness. There is no guarding or rebound.  Musculoskeletal:     Cervical back: Normal range of motion and neck supple.     Right lower leg: No edema.     Left lower leg: No edema.  Lymphadenopathy:     Cervical: No cervical adenopathy.  Skin:    Findings: No rash.  Neurological:     Mental Status: He is alert and oriented to person, place, and time.     Cranial Nerves: No cranial nerve deficit.     Deep Tendon Reflexes: Reflexes normal.     BP 110/70   Pulse  74   Temp 97.8 F (36.6 C) (Oral)   Ht 5' 7.6" (1.717 m)   Wt 171 lb 11.2 oz (77.9 kg)   SpO2 97%   BMI 26.42 kg/m  Wt Readings from Last 3 Encounters:  12/02/19 171 lb 11.2 oz (77.9 kg)  12/01/18 173 lb 8 oz (78.7 kg)  11/01/17 170 lb 4.8 oz (77.2 kg)     Health Maintenance Due  Topic Date Due  . Hepatitis C Screening  Never done  . HIV  Screening  Never done  . INFLUENZA VACCINE  09/27/2019    There are no preventive care reminders to display for this patient.  Lab Results  Component Value Date   TSH 1.37 12/01/2018   Lab Results  Component Value Date   WBC 6.2 12/01/2018   HGB 15.0 12/01/2018   HCT 42.8 12/01/2018   MCV 99.7 12/01/2018   PLT 220.0 12/01/2018   Lab Results  Component Value Date   NA 139 12/01/2018   K 4.2 12/01/2018   CO2 27 12/01/2018   GLUCOSE 86 12/01/2018   BUN 13 12/01/2018   CREATININE 0.86 12/01/2018   BILITOT 0.6 12/01/2018   ALKPHOS 81 12/01/2018   AST 12 12/01/2018   ALT 12 12/01/2018   PROT 6.4 12/01/2018   ALBUMIN 4.5 12/01/2018   CALCIUM 9.1 12/01/2018   ANIONGAP 7 10/26/2016   GFR 94.65 12/01/2018   Lab Results  Component Value Date   CHOL 185 12/01/2018   Lab Results  Component Value Date   HDL 38.90 (L) 12/01/2018   Lab Results  Component Value Date   LDLCALC 129 (H) 12/01/2018   Lab Results  Component Value Date   TRIG 81.0 12/01/2018   Lab Results  Component Value Date   CHOLHDL 5 12/01/2018   No results found for: HGBA1C    Assessment & Plan:   Problem List Items Addressed This Visit    None    Visit Diagnoses    Physical exam    -  Primary   Relevant Orders   Basic metabolic panel   Lipid panel   CBC with Differential/Platelet   TSH   Hepatic function panel   Hep C Antibody    We recommended screening labs including hepatitis C antibody  Flu vaccine given  Strongly encouraged to stop smoking.  Handout given on low oxalate diet  We discussed colon cancer screening and he declines  at this time but would like to consider at age 52  No orders of the defined types were placed in this encounter.   Follow-up: No follow-ups on file.    Evelena Peat, MD

## 2019-12-02 NOTE — Addendum Note (Signed)
Addended by: Anibal Henderson on: 12/02/2019 08:10 AM   Modules accepted: Orders

## 2019-12-02 NOTE — Patient Instructions (Signed)
Dietary Guidelines to Help Prevent Kidney Stones Kidney stones are deposits of minerals and salts that form inside your kidneys. Your risk of developing kidney stones may be greater depending on your diet, your lifestyle, the medicines you take, and whether you have certain medical conditions. Most people can reduce their chances of developing kidney stones by following the instructions below. Depending on your overall health and the type of kidney stones you tend to develop, your dietitian may give you more specific instructions. What are tips for following this plan? Reading food labels  Choose foods with "no salt added" or "low-salt" labels. Limit your sodium intake to less than 1500 mg per day.  Choose foods with calcium for each meal and snack. Try to eat about 300 mg of calcium at each meal. Foods that contain 200-500 mg of calcium per serving include: ? 8 oz (237 ml) of milk, fortified nondairy milk, and fortified fruit juice. ? 8 oz (237 ml) of kefir, yogurt, and soy yogurt. ? 4 oz (118 ml) of tofu. ? 1 oz of cheese. ? 1 cup (300 g) of dried figs. ? 1 cup (91 g) of cooked broccoli. ? 1-3 oz can of sardines or mackerel.  Most people need 1000 to 1500 mg of calcium each day. Talk to your dietitian about how much calcium is recommended for you. Shopping  Buy plenty of fresh fruits and vegetables. Most people do not need to avoid fruits and vegetables, even if they contain nutrients that may contribute to kidney stones.  When shopping for convenience foods, choose: ? Whole pieces of fruit. ? Premade salads with dressing on the side. ? Low-fat fruit and yogurt smoothies.  Avoid buying frozen meals or prepared deli foods.  Look for foods with live cultures, such as yogurt and kefir. Cooking  Do not add salt to food when cooking. Place a salt shaker on the table and allow each person to add his or her own salt to taste.  Use vegetable protein, such as beans, textured vegetable  protein (TVP), or tofu instead of meat in pasta, casseroles, and soups. Meal planning   Eat less salt, if told by your dietitian. To do this: ? Avoid eating processed or premade food. ? Avoid eating fast food.  Eat less animal protein, including cheese, meat, poultry, or fish, if told by your dietitian. To do this: ? Limit the number of times you have meat, poultry, fish, or cheese each week. Eat a diet free of meat at least 2 days a week. ? Eat only one serving each day of meat, poultry, fish, or seafood. ? When you prepare animal protein, cut pieces into small portion sizes. For most meat and fish, one serving is about the size of one deck of cards.  Eat at least 5 servings of fresh fruits and vegetables each day. To do this: ? Keep fruits and vegetables on hand for snacks. ? Eat 1 piece of fruit or a handful of berries with breakfast. ? Have a salad and fruit at lunch. ? Have two kinds of vegetables at dinner.  Limit foods that are high in a substance called oxalate. These include: ? Spinach. ? Rhubarb. ? Beets. ? Potato chips and french fries. ? Nuts.  If you regularly take a diuretic medicine, make sure to eat at least 1-2 fruits or vegetables high in potassium each day. These include: ? Avocado. ? Banana. ? Orange, prune, carrot, or tomato juice. ? Baked potato. ? Cabbage. ? Beans and split   peas. General instructions   Drink enough fluid to keep your urine clear or pale yellow. This is the most important thing you can do.  Talk to your health care provider and dietitian about taking daily supplements. Depending on your health and the cause of your kidney stones, you may be advised: ? Not to take supplements with vitamin C. ? To take a calcium supplement. ? To take a daily probiotic supplement. ? To take other supplements such as magnesium, fish oil, or vitamin B6.  Take all medicines and supplements as told by your health care provider.  Limit alcohol intake to no  more than 1 drink a day for nonpregnant women and 2 drinks a day for men. One drink equals 12 oz of beer, 5 oz of wine, or 1 oz of hard liquor.  Lose weight if told by your health care provider. Work with your dietitian to find strategies and an eating plan that works best for you. What foods are not recommended? Limit your intake of the following foods, or as told by your dietitian. Talk to your dietitian about specific foods you should avoid based on the type of kidney stones and your overall health. Grains Breads. Bagels. Rolls. Baked goods. Salted crackers. Cereal. Pasta. Vegetables Spinach. Rhubarb. Beets. Canned vegetables. Pickles. Olives. Meats and other protein foods Nuts. Nut butters. Large portions of meat, poultry, or fish. Salted or cured meats. Deli meats. Hot dogs. Sausages. Dairy Cheese. Beverages Regular soft drinks. Regular vegetable juice. Seasonings and other foods Seasoning blends with salt. Salad dressings. Canned soups. Soy sauce. Ketchup. Barbecue sauce. Canned pasta sauce. Casseroles. Pizza. Lasagna. Frozen meals. Potato chips. French fries. Summary  You can reduce your risk of kidney stones by making changes to your diet.  The most important thing you can do is drink enough fluid. You should drink enough fluid to keep your urine clear or pale yellow.  Ask your health care provider or dietitian how much protein from animal sources you should eat each day, and also how much salt and calcium you should have each day. This information is not intended to replace advice given to you by your health care provider. Make sure you discuss any questions you have with your health care provider. Document Revised: 06/04/2018 Document Reviewed: 01/24/2016 Elsevier Patient Education  2020 [employer]  

## 2019-12-02 NOTE — Addendum Note (Signed)
Addended by: Marrion Coy on: 12/02/2019 08:12 AM   Modules accepted: Orders

## 2019-12-03 LAB — CBC WITH DIFFERENTIAL/PLATELET
Absolute Monocytes: 507 cells/uL (ref 200–950)
Basophils Absolute: 59 cells/uL (ref 0–200)
Basophils Relative: 0.9 %
Eosinophils Absolute: 117 cells/uL (ref 15–500)
Eosinophils Relative: 1.8 %
HCT: 45.5 % (ref 38.5–50.0)
Hemoglobin: 15.9 g/dL (ref 13.2–17.1)
Lymphs Abs: 1924 cells/uL (ref 850–3900)
MCH: 34.6 pg — ABNORMAL HIGH (ref 27.0–33.0)
MCHC: 34.9 g/dL (ref 32.0–36.0)
MCV: 98.9 fL (ref 80.0–100.0)
MPV: 10 fL (ref 7.5–12.5)
Monocytes Relative: 7.8 %
Neutro Abs: 3894 cells/uL (ref 1500–7800)
Neutrophils Relative %: 59.9 %
Platelets: 233 10*3/uL (ref 140–400)
RBC: 4.6 10*6/uL (ref 4.20–5.80)
RDW: 11.8 % (ref 11.0–15.0)
Total Lymphocyte: 29.6 %
WBC: 6.5 10*3/uL (ref 3.8–10.8)

## 2019-12-03 LAB — LIPID PANEL
Cholesterol: 211 mg/dL — ABNORMAL HIGH (ref <200)
HDL: 43 mg/dL (ref >=40)
LDL Cholesterol (Calc): 143 mg/dL (calc) — ABNORMAL HIGH
Non-HDL Cholesterol (Calc): 168 mg/dL (calc) — ABNORMAL HIGH (ref <130)
Total CHOL/HDL Ratio: 4.9 (calc) (ref <5.0)
Triglycerides: 123 mg/dL (ref <150)

## 2019-12-03 LAB — HEPATIC FUNCTION PANEL
AG Ratio: 2.3 (calc) (ref 1.0–2.5)
ALT: 14 U/L (ref 9–46)
AST: 14 U/L (ref 10–40)
Albumin: 4.5 g/dL (ref 3.6–5.1)
Alkaline phosphatase (APISO): 98 U/L (ref 36–130)
Bilirubin, Direct: 0.1 mg/dL (ref 0.0–0.2)
Globulin: 2 g/dL (calc) (ref 1.9–3.7)
Indirect Bilirubin: 0.5 mg/dL (calc) (ref 0.2–1.2)
Total Bilirubin: 0.6 mg/dL (ref 0.2–1.2)
Total Protein: 6.5 g/dL (ref 6.1–8.1)

## 2019-12-03 LAB — BASIC METABOLIC PANEL
BUN: 13 mg/dL (ref 7–25)
CO2: 26 mmol/L (ref 20–32)
Calcium: 9.7 mg/dL (ref 8.6–10.3)
Chloride: 106 mmol/L (ref 98–110)
Creat: 0.88 mg/dL (ref 0.60–1.35)
Glucose, Bld: 87 mg/dL (ref 65–99)
Potassium: 4.4 mmol/L (ref 3.5–5.3)
Sodium: 138 mmol/L (ref 135–146)

## 2019-12-03 LAB — TSH: TSH: 1.35 mIU/L (ref 0.40–4.50)

## 2020-08-30 ENCOUNTER — Encounter: Payer: Self-pay | Admitting: Family Medicine

## 2020-08-31 ENCOUNTER — Telehealth (INDEPENDENT_AMBULATORY_CARE_PROVIDER_SITE_OTHER): Payer: 59 | Admitting: Family Medicine

## 2020-08-31 ENCOUNTER — Other Ambulatory Visit: Payer: Self-pay

## 2020-08-31 DIAGNOSIS — U071 COVID-19: Secondary | ICD-10-CM

## 2020-08-31 MED ORDER — NIRMATRELVIR/RITONAVIR (PAXLOVID)TABLET: 3.0000 | ORAL_TABLET | Freq: Two times a day (BID) | ORAL | 0 refills | Status: AC

## 2020-08-31 MED ORDER — NIRMATRELVIR/RITONAVIR (PAXLOVID)TABLET: 3.0000 | ORAL_TABLET | Freq: Two times a day (BID) | ORAL | 0 refills | Status: DC

## 2020-08-31 NOTE — Progress Notes (Signed)
Patient ID: Manuel Page, male   DOB: 1970/08/09, 50 y.o.   MRN: [id number]   This visit type was conducted due to national recommendations for restrictions regarding the COVID-19 pandemic in an effort to limit this patient's exposure and mitigate transmission in our community.   Virtual Visit via Video Note  I connected with Manuel Page on 08/31/20 at  5:00 PM EDT by a video enabled telemedicine application and verified that I am speaking with the correct person using two identifiers.  Location patient: home Location provider:work or home office Persons participating in the virtual visit: patient, provider  I discussed the limitations of evaluation and management by telemedicine and the availability of in person appointments. The patient expressed understanding and agreed to proceed.   HPI:  Manuel Page called with positive COVID test.  He states his wife had onset of symptoms last weekend and tested positive on Saturday.  Her symptoms were very short-lived.  He had onset of symptoms on the fifth which was yesterday.  He had some chills and fever of 102.  Has had some cough.  No significant sore throat.  No nausea or vomiting.  Positive COVID test.  He does have long history of smoking but no chronic major lung disease.  Currently takes no regular medications.  Feels slightly better today.  No significant dyspnea.   ROS: See pertinent positives and negatives per HPI.  Past Medical History:  Diagnosis Date   Chronic kidney disease    stones   Prostate infection     No past surgical history on file.  Family History  Problem Relation Age of Onset   Hypertension Father    Cancer Father        ?hepatic cancer    SOCIAL HX: Smoking history as above   Current Outpatient Medications:    EPINEPHrine (EPIPEN 2-PAK) 0.3 mg/0.3 mL IJ SOAJ injection, Inject 0.3 mLs (0.3 mg total) into the muscle once., Disp: 2 Device, Rfl: 0   nirmatrelvir/ritonavir EUA (PAXLOVID) TABS, Take 3 tablets by  mouth 2 (two) times daily for 5 days. Patient GFR is 94. Take nirmatrelvir (150 mg) two tablets twice daily for 5 days and ritonavir (100 mg) one tablet twice daily for 5 days., Disp: 30 tablet, Rfl: 0  EXAM:  VITALS per patient if applicable:  GENERAL: alert, oriented, appears well and in no acute distress  HEENT: atraumatic, conjunttiva clear, no obvious abnormalities on inspection of external nose and ears  NECK: normal movements of the head and neck  LUNGS: on inspection no signs of respiratory distress, breathing rate appears normal, no obvious gross SOB, gasping or wheezing  CV: no obvious cyanosis  MS: moves all visible extremities without noticeable abnormality  PSYCH/NEURO: pleasant and cooperative, no obvious depression or anxiety, speech and thought processing grossly intact  ASSESSMENT AND PLAN:  Discussed the following assessment and plan:  COVID-19-risk factor of cigarette smoking.  Currently symptoms relatively mild.  -He is aware of isolation recommendations -Plenty of fluids and rest -Follow-up immediately for any progressive dyspnea or other concerns -We discussed pros and cons of antiviral therapy.  We ended up offering Paxlovid 3 capsules twice daily for 5 days.  He has history of normal renal function.     I discussed the assessment and treatment plan with the patient. The patient was provided an opportunity to ask questions and all were answered. The patient agreed with the plan and demonstrated an understanding of the instructions.   The patient was advised to  call back or seek an in-person evaluation if the symptoms worsen or if the condition fails to improve as anticipated.     Evelena Peat, MD

## 2020-12-06 ENCOUNTER — Other Ambulatory Visit: Payer: Self-pay

## 2020-12-07 ENCOUNTER — Ambulatory Visit (INDEPENDENT_AMBULATORY_CARE_PROVIDER_SITE_OTHER): Payer: 59 | Admitting: Family Medicine

## 2020-12-07 ENCOUNTER — Encounter: Payer: Self-pay | Admitting: Family Medicine

## 2020-12-07 VITALS — BP 122/82 | HR 70 | Temp 97.9°F | Ht 67.6 in | Wt 174.2 lb

## 2020-12-07 DIAGNOSIS — Z125 Encounter for screening for malignant neoplasm of prostate: Secondary | ICD-10-CM

## 2020-12-07 DIAGNOSIS — Z0000 Encounter for general adult medical examination without abnormal findings: Secondary | ICD-10-CM | POA: Diagnosis not present

## 2020-12-07 DIAGNOSIS — Z1211 Encounter for screening for malignant neoplasm of colon: Secondary | ICD-10-CM

## 2020-12-07 DIAGNOSIS — Z23 Encounter for immunization: Secondary | ICD-10-CM

## 2020-12-07 LAB — CBC WITH DIFFERENTIAL/PLATELET
Basophils Absolute: 0.1 10*3/uL (ref 0.0–0.1)
Basophils Relative: 1.1 % (ref 0.0–3.0)
Eosinophils Absolute: 0.1 10*3/uL (ref 0.0–0.7)
Eosinophils Relative: 1.8 % (ref 0.0–5.0)
HCT: 45.3 % (ref 39.0–52.0)
Hemoglobin: 15.6 g/dL (ref 13.0–17.0)
Lymphocytes Relative: 30.6 % (ref 12.0–46.0)
Lymphs Abs: 1.8 10*3/uL (ref 0.7–4.0)
MCHC: 34.4 g/dL (ref 30.0–36.0)
MCV: 99.7 fl (ref 78.0–100.0)
Monocytes Absolute: 0.4 10*3/uL (ref 0.1–1.0)
Monocytes Relative: 7.1 % (ref 3.0–12.0)
Neutro Abs: 3.5 10*3/uL (ref 1.4–7.7)
Neutrophils Relative %: 59.4 % (ref 43.0–77.0)
Platelets: 224 10*3/uL (ref 150.0–400.0)
RBC: 4.55 Mil/uL (ref 4.22–5.81)
RDW: 12.3 % (ref 11.5–15.5)
WBC: 5.9 10*3/uL (ref 4.0–10.5)

## 2020-12-07 LAB — HEPATIC FUNCTION PANEL
ALT: 18 U/L (ref 0–53)
AST: 17 U/L (ref 0–37)
Albumin: 4.5 g/dL (ref 3.5–5.2)
Alkaline Phosphatase: 88 U/L (ref 39–117)
Bilirubin, Direct: 0.1 mg/dL (ref 0.0–0.3)
Total Bilirubin: 0.7 mg/dL (ref 0.2–1.2)
Total Protein: 6.5 g/dL (ref 6.0–8.3)

## 2020-12-07 LAB — BASIC METABOLIC PANEL
BUN: 15 mg/dL (ref 6–23)
CO2: 25 mEq/L (ref 19–32)
Calcium: 9.5 mg/dL (ref 8.4–10.5)
Chloride: 105 mEq/L (ref 96–112)
Creatinine, Ser: 0.84 mg/dL (ref 0.40–1.50)
GFR: 101.55 mL/min (ref >60.00)
Glucose, Bld: 90 mg/dL (ref 70–99)
Potassium: 4.1 mEq/L (ref 3.5–5.1)
Sodium: 139 mEq/L (ref 135–145)

## 2020-12-07 LAB — LIPID PANEL
Cholesterol: 217 mg/dL — ABNORMAL HIGH (ref 0–200)
HDL: 43 mg/dL (ref >39.00)
LDL Cholesterol: 154 mg/dL — ABNORMAL HIGH (ref 0–99)
NonHDL: 173.73
Total CHOL/HDL Ratio: 5
Triglycerides: 99 mg/dL (ref 0.0–149.0)
VLDL: 19.8 mg/dL (ref 0.0–40.0)

## 2020-12-07 LAB — PSA: PSA: 1.08 ng/mL (ref 0.10–4.00)

## 2020-12-07 LAB — TSH: TSH: 0.94 u[IU]/mL (ref 0.35–5.50)

## 2020-12-07 NOTE — Progress Notes (Signed)
Established Patient Office Visit  Subjective:  Patient ID: Manuel Page, male    DOB: [birth year]  Age: 50 y.o. MRN: [id number]  CC:  Chief Complaint  Patient presents with   Annual Exam    HPI Manuel Page presents for annual physical exam.  He has no chronic medical problems.  Does still smoke about a pack of cigarettes per day but plans to try to quit later this year.  Just turned 50 this year.  No history of colon cancer screening.  We had ordered hepatitis C antibody last year but for some reason this was not drawn.  Generally doing well.  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  Health maintenance reviewed  -Needs flu vaccine -Tetanus due 2025 -No history of shingles vaccine.  He declines today -Needs colonoscopy screening.  He is willing to get this set up. -No history of hepatitis C screening.  He is low risk.  Family history-reviewed with no changes.  His mother has significant arthritis issues.  She had complications from back surgery and is very immobile.  Father died of reported hepatic cancer.  Not sure if this was a primary.  He also had hypertension history.  Sister alive and well.  No family history of premature CAD or type 2 diabetes.  Social history-he is married.  No children.  Works as an Art gallery manager.  Smokes 1 pack cigarettes per day.  No regular alcohol use.  Past Medical History:  Diagnosis Date   Chronic kidney disease    stones   Prostate infection     No past surgical history on file.  Family History  Problem Relation Age of Onset   Arthritis Mother    Hypertension Father    Cancer Father        ?hepatic cancer    Social History   Socioeconomic History   Marital status: Married    Spouse name: Not on file   Number of children: Not on file   Years of education: Not on file   Highest education level: Not on file  Occupational History   Not on file   Tobacco Use   Smoking status: Every Day    Packs/day: 1.00    Years: 20.00    Pack years: 20.00    Types: Cigarettes   Smokeless tobacco: Never  Vaping Use   Vaping Use: Never used  Substance and Sexual Activity   Alcohol use: No   Drug use: No   Sexual activity: Not on file  Other Topics Concern   Not on file  Social History Narrative   Not on file   Social Determinants of Health   Financial Resource Strain: Not on file  Food Insecurity: Not on file  Transportation Needs: Not on file  Physical Activity: Not on file  Stress: Not on file  Social Connections: Not on file  Intimate Partner Violence: Not on file    Outpatient Medications Prior to Visit  Medication Sig Dispense Refill   EPINEPHrine (EPIPEN 2-PAK) 0.3 mg/0.3 mL IJ SOAJ injection Inject 0.3 mLs (0.3 mg total) into the muscle once. 2 Device 0   No facility-administered medications prior to visit.    Allergies  Allergen Reactions   Other Swelling   Sulfa Drugs Cross Reactors     Eye swelling    ROS Review of Systems  Constitutional:  Negative for activity change, appetite  change, fatigue and fever.  HENT:  Negative for congestion, ear pain and trouble swallowing.   Eyes:  Negative for pain and visual disturbance.  Respiratory:  Negative for cough, shortness of breath and wheezing.   Cardiovascular:  Negative for chest pain and palpitations.  Gastrointestinal:  Negative for abdominal distention, abdominal pain, blood in stool, constipation, diarrhea, nausea, rectal pain and vomiting.  Endocrine: Negative for polydipsia and polyuria.  Genitourinary:  Negative for dysuria, hematuria and testicular pain.  Musculoskeletal:  Negative for arthralgias and joint swelling.  Skin:  Negative for rash.  Neurological:  Negative for dizziness, syncope and headaches.  Hematological:  Negative for adenopathy.  Psychiatric/Behavioral:  Negative for confusion and dysphoric mood.      Objective:    Physical  Exam Constitutional:      General: He is not in acute distress.    Appearance: He is well-developed.  HENT:     Head: Normocephalic and atraumatic.     Right Ear: External ear normal.     Left Ear: External ear normal.  Eyes:     Pupils: Pupils are equal, round, and reactive to light.  Neck:     Thyroid: No thyromegaly.  Cardiovascular:     Rate and Rhythm: Normal rate and regular rhythm.     Heart sounds: Normal heart sounds. No murmur heard. Pulmonary:     Effort: No respiratory distress.     Breath sounds: No wheezing or rales.  Abdominal:     General: Bowel sounds are normal. There is no distension.     Palpations: Abdomen is soft. There is no mass.     Tenderness: There is no abdominal tenderness. There is no guarding or rebound.  Musculoskeletal:     Cervical back: Normal range of motion and neck supple.     Right lower leg: No edema.     Left lower leg: No edema.  Lymphadenopathy:     Cervical: No cervical adenopathy.  Skin:    Findings: No rash.  Neurological:     Mental Status: He is alert and oriented to person, place, and time.     Cranial Nerves: No cranial nerve deficit.    BP 122/82 (BP Location: Left Arm, Patient Position: Sitting, Cuff Size: Normal)   Pulse 70   Temp 97.9 F (36.6 C) (Oral)   Ht 5' 7.6" (1.717 m)   Wt 174 lb 3.2 oz (79 kg)   SpO2 97%   BMI 26.80 kg/m  Wt Readings from Last 3 Encounters:  12/07/20 174 lb 3.2 oz (79 kg)  12/02/19 171 lb 11.2 oz (77.9 kg)  12/01/18 173 lb 8 oz (78.7 kg)     Health Maintenance Due  Topic Date Due   HIV Screening  Never done   Hepatitis C Screening  Never done   COLONOSCOPY (Pts 45-29yrs Insurance coverage will need to be confirmed)  Never done   COVID-19 Vaccine (3 - Booster for Pfizer series) 11/15/2019    There are no preventive care reminders to display for this patient.  Lab Results  Component Value Date   TSH 1.35 12/02/2019   Lab Results  Component Value Date   WBC 6.5 12/02/2019    HGB 15.9 12/02/2019   HCT 45.5 12/02/2019   MCV 98.9 12/02/2019   PLT 233 12/02/2019   Lab Results  Component Value Date   NA 138 12/02/2019   K 4.4 12/02/2019   CO2 26 12/02/2019   GLUCOSE 87 12/02/2019   BUN 13 12/02/2019  CREATININE 0.88 12/02/2019   BILITOT 0.6 12/02/2019   ALKPHOS 81 12/01/2018   AST 14 12/02/2019   ALT 14 12/02/2019   PROT 6.5 12/02/2019   ALBUMIN 4.5 12/01/2018   CALCIUM 9.7 12/02/2019   ANIONGAP 7 10/26/2016   GFR 94.65 12/01/2018   Lab Results  Component Value Date   CHOL 211 (H) 12/02/2019   Lab Results  Component Value Date   HDL 43 12/02/2019   Lab Results  Component Value Date   LDLCALC 143 (H) 12/02/2019   Lab Results  Component Value Date   TRIG 123 12/02/2019   Lab Results  Component Value Date   CHOLHDL 4.9 12/02/2019   No results found for: HGBA1C    Assessment & Plan:   Problem List Items Addressed This Visit   None Visit Diagnoses     Need for immunization against influenza    -  Primary   Relevant Orders   Flu Vaccine QUAD 67mo+IM (Fluarix, Fluzone & Alfiuria Quad PF) (Completed)   Colon cancer screening       Relevant Orders   Ambulatory referral to Gastroenterology   Physical exam       Relevant Orders   Basic metabolic panel   Lipid panel   CBC with Differential/Platelet   TSH   Hepatic function panel   Hep C Antibody   PSA     -Strongly advised that he quit smoking.  He plans to try to stop later this year. -Flu vaccine given -Recommend Shingrix vaccine and he will consider getting later this year. -Screening labs as above.  Get baseline PSA.  Check hepatitis C antibody. -Set up screening colonoscopy  No orders of the defined types were placed in this encounter.   Follow-up: No follow-ups on file.    Evelena Peat, MD

## 2020-12-08 LAB — HEPATITIS C ANTIBODY
Hepatitis C Ab: NONREACTIVE
SIGNAL TO CUT-OFF: 0.04 (ref <1.00)

## 2021-02-14 ENCOUNTER — Encounter: Payer: Self-pay | Admitting: Internal Medicine

## 2021-04-10 ENCOUNTER — Encounter: Payer: 59 | Admitting: Internal Medicine

## 2021-11-10 DIAGNOSIS — M25511 Pain in right shoulder: Secondary | ICD-10-CM | POA: Insufficient documentation

## 2021-11-10 DIAGNOSIS — M542 Cervicalgia: Secondary | ICD-10-CM | POA: Insufficient documentation

## 2021-11-21 ENCOUNTER — Encounter: Payer: Self-pay | Admitting: Internal Medicine

## 2021-12-08 ENCOUNTER — Ambulatory Visit (INDEPENDENT_AMBULATORY_CARE_PROVIDER_SITE_OTHER): Payer: BC Managed Care – PPO | Admitting: Family Medicine

## 2021-12-08 ENCOUNTER — Encounter: Payer: Self-pay | Admitting: Family Medicine

## 2021-12-08 VITALS — BP 120/80 | HR 67 | Temp 97.6°F | Ht 68.5 in | Wt 184.7 lb

## 2021-12-08 DIAGNOSIS — Z0000 Encounter for general adult medical examination without abnormal findings: Secondary | ICD-10-CM

## 2021-12-08 DIAGNOSIS — B078 Other viral warts: Secondary | ICD-10-CM | POA: Diagnosis not present

## 2021-12-08 DIAGNOSIS — Z23 Encounter for immunization: Secondary | ICD-10-CM

## 2021-12-08 DIAGNOSIS — Z0001 Encounter for general adult medical examination with abnormal findings: Secondary | ICD-10-CM

## 2021-12-08 DIAGNOSIS — Z125 Encounter for screening for malignant neoplasm of prostate: Secondary | ICD-10-CM | POA: Diagnosis not present

## 2021-12-08 LAB — CBC WITH DIFFERENTIAL/PLATELET
Basophils Absolute: 0 10*3/uL (ref 0.0–0.1)
Basophils Relative: 0.7 % (ref 0.0–3.0)
Eosinophils Absolute: 0.1 10*3/uL (ref 0.0–0.7)
Eosinophils Relative: 1.9 % (ref 0.0–5.0)
HCT: 41.2 % (ref 39.0–52.0)
Hemoglobin: 14.4 g/dL (ref 13.0–17.0)
Lymphocytes Relative: 30.8 % (ref 12.0–46.0)
Lymphs Abs: 1.5 10*3/uL (ref 0.7–4.0)
MCHC: 34.8 g/dL (ref 30.0–36.0)
MCV: 96.1 fl (ref 78.0–100.0)
Monocytes Absolute: 0.4 10*3/uL (ref 0.1–1.0)
Monocytes Relative: 8.2 % (ref 3.0–12.0)
Neutro Abs: 2.8 10*3/uL (ref 1.4–7.7)
Neutrophils Relative %: 58.4 % (ref 43.0–77.0)
Platelets: 226 10*3/uL (ref 150.0–400.0)
RBC: 4.29 Mil/uL (ref 4.22–5.81)
RDW: 11.8 % (ref 11.5–15.5)
WBC: 4.7 10*3/uL (ref 4.0–10.5)

## 2021-12-08 LAB — BASIC METABOLIC PANEL
BUN: 16 mg/dL (ref 6–23)
CO2: 28 mEq/L (ref 19–32)
Calcium: 9.5 mg/dL (ref 8.4–10.5)
Chloride: 104 mEq/L (ref 96–112)
Creatinine, Ser: 0.88 mg/dL (ref 0.40–1.50)
GFR: 99.43 mL/min (ref >60.00)
Glucose, Bld: 89 mg/dL (ref 70–99)
Potassium: 4.1 mEq/L (ref 3.5–5.1)
Sodium: 139 mEq/L (ref 135–145)

## 2021-12-08 LAB — LIPID PANEL
Cholesterol: 217 mg/dL — ABNORMAL HIGH (ref 0–200)
HDL: 46.7 mg/dL (ref >39.00)
LDL Cholesterol: 154 mg/dL — ABNORMAL HIGH (ref 0–99)
NonHDL: 170.69
Total CHOL/HDL Ratio: 5
Triglycerides: 83 mg/dL (ref 0.0–149.0)
VLDL: 16.6 mg/dL (ref 0.0–40.0)

## 2021-12-08 LAB — HEPATIC FUNCTION PANEL
ALT: 13 U/L (ref 0–53)
AST: 13 U/L (ref 0–37)
Albumin: 4.4 g/dL (ref 3.5–5.2)
Alkaline Phosphatase: 74 U/L (ref 39–117)
Bilirubin, Direct: 0.1 mg/dL (ref 0.0–0.3)
Total Bilirubin: 0.6 mg/dL (ref 0.2–1.2)
Total Protein: 6.6 g/dL (ref 6.0–8.3)

## 2021-12-08 LAB — TSH: TSH: 1.14 u[IU]/mL (ref 0.35–5.50)

## 2021-12-08 LAB — PSA: PSA: 0.63 ng/mL (ref 0.10–4.00)

## 2021-12-08 NOTE — Progress Notes (Signed)
Established Patient Office Visit  Subjective   Patient ID: Manuel Page, male    DOB: 1970/07/22  Age: 51 y.o. MRN: [id number]  Chief Complaint  Patient presents with   Annual Exam    HPI   [name] is seen for physical exam.  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  He is enjoying that.  Generally doing well.  Scheduled for colonoscopy in December. He did quit smoking last January.  He does have separate issue of common wart volar surface right wrist.  Came up few months ago.  He tried over-the-counter liquid nitrogen without much improvement.  Would like to have this treated.  Health maintenance reviewed  -Flu vaccine given today -Declines Shingrix vaccine -Had otitis C screen last year negative -Colonoscopy scheduled as above -Tetanus due 2025  Social history-married.  No children.  Quit smoking last January.  No regular alcohol.  Government social research officer at [employer].  Family history-father died of hepatic cancer uncertain whether this was primary or metastatic.  Mother has history of obesity and some chronic back difficulties.  Sister alive and well.  Past Medical History:  Diagnosis Date   Chronic kidney disease    stones   Prostate infection    History reviewed. No pertinent surgical history.  reports that he has been smoking cigarettes. He has a 20.00 pack-year smoking history. He has never used smokeless tobacco. He reports that he does not drink alcohol and does not use drugs. family history includes Arthritis in his mother; Cancer in his father; Hypertension in his father. Allergies  Allergen Reactions   Other Swelling   Sulfa Drugs Cross Reactors     Eye swelling      - Review of Systems  Constitutional:  Negative for chills, fever, malaise/fatigue and weight loss.  HENT:  Negative for hearing loss.   Eyes:  Negative for blurred vision and  double vision.  Respiratory:  Negative for cough and shortness of breath.   Cardiovascular:  Negative for chest pain, palpitations and leg swelling.  Gastrointestinal:  Negative for abdominal pain, blood in stool, constipation and diarrhea.  Genitourinary:  Negative for dysuria.  Skin:  Negative for rash.  Neurological:  Negative for dizziness, speech change, seizures, loss of consciousness and headaches.  Psychiatric/Behavioral:  Negative for depression.       Objective:     BP 120/80 (BP Location: Left Arm, Patient Position: Sitting, Cuff Size: Normal)   Pulse 67   Temp 97.6 F (36.4 C) (Oral)   Ht 5' 8.5" (1.74 m)   Wt 184 lb 11.2 oz (83.8 kg)   SpO2 97%   BMI 27.67 kg/m  BP Readings from Last 3 Encounters:  12/08/21 120/80  12/07/20 122/82  12/02/19 110/70   Wt Readings from Last 3 Encounters:  12/08/21 184 lb 11.2 oz (83.8 kg)  12/07/20 174 lb 3.2 oz (79 kg)  12/02/19 171 lb 11.2 oz (77.9 kg)      Physical Exam Vitals reviewed.  Constitutional:      General: He is not in acute distress.    Appearance: He is well-developed.  HENT:     Head: Normocephalic and atraumatic.     Right Ear: External ear normal.     Left Ear: External ear normal.  Eyes:     Conjunctiva/sclera: Conjunctivae normal.     Pupils: Pupils are equal, round, and reactive to light.  Neck:     Thyroid: No thyromegaly.  Cardiovascular:     Rate and Rhythm: Normal rate and regular rhythm.     Heart sounds: Normal heart sounds. No murmur heard. Pulmonary:     Effort: No respiratory distress.     Breath sounds: No wheezing or rales.  Abdominal:     General: Bowel sounds are normal. There is no distension.     Palpations: Abdomen is soft. There is no mass.     Tenderness: There is no abdominal tenderness. There is no guarding or rebound.  Musculoskeletal:     Cervical back: Normal range of motion and neck supple.     Right lower leg: No edema.     Left lower leg: No edema.   Lymphadenopathy:     Cervical: No cervical adenopathy.  Skin:    Findings: No rash.  Neurological:     Mental Status: He is alert and oriented to person, place, and time.     Cranial Nerves: No cranial nerve deficit.     Deep Tendon Reflexes: Reflexes normal.      No results found for any visits on 12/08/21.    The 10-year ASCVD risk score (Arnett DK, et al., 2019) is: 9.5%    Assessment & Plan:   #1 physical exam.  Patient quit smoking last January.  No active medical problems.  Takes no regular medications.  Discussed the following health maintenance items  -Has had some weight gain this past year after stopping smoking.  Watch overall calories -Flu vaccine given -Discussed shingles vaccine and he declines at this time -Colonoscopy scheduled as above -Obtain screening labs as above  #2 Common wart right wrist.  Patient requesting treatment with liquid nitrogen.  We discussed risk including pain, blistering, low risk of infection.  This was treated with liquid nitrogen and patient tolerated well.  Touch base in 2 to 3 weeks if this is not resolving.   No follow-ups on file.    Evelena Peat, MD

## 2021-12-11 ENCOUNTER — Telehealth: Payer: Self-pay | Admitting: Family Medicine

## 2021-12-11 NOTE — Telephone Encounter (Signed)
Pt called, returning CMA's call. CMA was unavailable. Pt asked that CMA call back at their earliest convenience. 

## 2021-12-11 NOTE — Telephone Encounter (Signed)
Please see result note 

## 2022-01-23 ENCOUNTER — Ambulatory Visit (AMBULATORY_SURGERY_CENTER): Payer: Self-pay

## 2022-01-23 VITALS — Ht 70.0 in | Wt 175.0 lb

## 2022-01-23 DIAGNOSIS — Z1211 Encounter for screening for malignant neoplasm of colon: Secondary | ICD-10-CM

## 2022-01-23 MED ORDER — NA SULFATE-K SULFATE-MG SULF 17.5-3.13-1.6 GM/177ML PO SOLN: 1.0000 | Freq: Once | ORAL | 0 refills | Status: AC

## 2022-01-23 NOTE — Progress Notes (Signed)

## 2022-02-07 ENCOUNTER — Encounter: Payer: Self-pay | Admitting: Internal Medicine

## 2022-02-13 ENCOUNTER — Ambulatory Visit (AMBULATORY_SURGERY_CENTER): Payer: BC Managed Care – PPO | Admitting: Internal Medicine

## 2022-02-13 ENCOUNTER — Encounter: Payer: Self-pay | Admitting: Internal Medicine

## 2022-02-13 VITALS — BP 121/79 | HR 56 | Temp 97.1°F | Resp 19 | Ht 68.0 in | Wt 175.0 lb

## 2022-02-13 DIAGNOSIS — Z1211 Encounter for screening for malignant neoplasm of colon: Secondary | ICD-10-CM | POA: Diagnosis present

## 2022-02-13 DIAGNOSIS — D124 Benign neoplasm of descending colon: Secondary | ICD-10-CM | POA: Diagnosis not present

## 2022-02-13 MED ORDER — SODIUM CHLORIDE 0.9 % IV SOLN: 500.0000 mL | Freq: Once | INTRAVENOUS | Status: DC

## 2022-02-13 NOTE — Op Note (Signed)
[facility] Patient Name: Manuel Page Procedure Date: 02/13/2022 8:28 AM MRN: [id number] Endoscopist: Beverley Fiedler , MD, [id number] Age: 51 Referring MD:  Date of Birth: 1970-09-01 Gender: Male Account #: [id number] Procedure:                Colonoscopy Indications:              Screening for colorectal malignant neoplasm, This                            is the patient's first colonoscopy Medicines:                Monitored Anesthesia Care Procedure:                Pre-Anesthesia Assessment:                           - Prior to the procedure, a History and Physical                            was performed, and patient medications and                            allergies were reviewed. The patient's tolerance of                            previous anesthesia was also reviewed. The risks                            and benefits of the procedure and the sedation                            options and risks were discussed with the patient.                            All questions were answered, and informed consent                            was obtained. Prior Anticoagulants: The patient has                            taken no anticoagulant or antiplatelet agents. ASA                            Grade Assessment: II - A patient with mild systemic                            disease. After reviewing the risks and benefits,                            the patient was deemed in satisfactory condition to                            undergo the procedure.  After obtaining informed consent, the colonoscope                            was passed under direct vision. Throughout the                            procedure, the patient's blood pressure, pulse, and                            oxygen saturations were monitored continuously. The                            Olympus CF-HQ190L [id number]) Colonoscope was                            introduced through the anus  and advanced to the                            cecum, identified by appendiceal orifice and                            ileocecal valve. The colonoscopy was performed                            without difficulty. The patient tolerated the                            procedure well. The quality of the bowel                            preparation was good. The ileocecal valve,                            appendiceal orifice, and rectum were photographed. Scope In: 8:35:00 AM Scope Out: 8:50:42 AM Scope Withdrawal Time: 0 hours 13 minutes 31 seconds  Total Procedure Duration: 0 hours 15 minutes 42 seconds  Findings:                 The digital rectal exam was normal.                           Two sessile polyps were found in the descending                            colon. The polyps were 5 to 6 mm in size. These                            polyps were removed with a cold snare. Resection                            and retrieval were complete.                           The exam was otherwise without abnormality on  direct and retroflexion views. Complications:            No immediate complications. Estimated Blood Loss:     Estimated blood loss was minimal. Impression:               - Two 5 to 6 mm polyps in the descending colon,                            removed with a cold snare. Resected and retrieved.                           - The examination was otherwise normal on direct                            and retroflexion views. Recommendation:           - Patient has a contact number available for                            emergencies. The signs and symptoms of potential                            delayed complications were discussed with the                            patient. Return to normal activities tomorrow.                            Written discharge instructions were provided to the                            patient.                           - Resume  previous diet.                           - Continue present medications.                           - Await pathology results.                           - Repeat colonoscopy is recommended. The                            colonoscopy date will be determined after pathology                            results from today's exam become available for                            review. Beverley Fiedler, MD 02/13/2022 8:52:32 AM This report has been signed electronically.

## 2022-02-13 NOTE — Progress Notes (Signed)
GASTROENTEROLOGY PROCEDURE H&P NOTE   Primary Care Physician: Eulas Post, MD    Reason for Procedure:   Colon cancer screening  Plan:    colonoscopy  Patient is appropriate for endoscopic procedure(s) in the ambulatory ([place]) setting.  The nature of the procedure, as well as the risks, benefits, and alternatives were carefully and thoroughly reviewed with the patient. Ample time for discussion and questions allowed. The patient understood, was satisfied, and agreed to proceed.     HPI: Manuel Page is a 51 y.o. male who presents for colonoscopy.  Medical history as below.  Tolerated the prep.  No recent chest pain or shortness of breath.  No abdominal pain today.  Past Medical History:  Diagnosis Date   Chronic kidney disease    stones   Prostate infection     History reviewed. No pertinent surgical history.  Prior to Admission medications   Medication Sig Start Date End Date Taking? Authorizing Provider  EPINEPHrine (EPIPEN 2-PAK) 0.3 mg/0.3 mL IJ SOAJ injection Inject 0.3 mLs (0.3 mg total) into the muscle once. Patient not taking: Reported on 01/23/2022 10/19/13   Eulas Post, MD  methocarbamol (ROBAXIN) 500 MG tablet Take 1 tablet by mouth 2 (two) times daily. Patient not taking: Reported on 02/13/2022    [provider]    Current Outpatient Medications  Medication Sig Dispense Refill   EPINEPHrine (EPIPEN 2-PAK) 0.3 mg/0.3 mL IJ SOAJ injection Inject 0.3 mLs (0.3 mg total) into the muscle once. (Patient not taking: Reported on 01/23/2022) 2 Device 0   methocarbamol (ROBAXIN) 500 MG tablet Take 1 tablet by mouth 2 (two) times daily. (Patient not taking: Reported on 02/13/2022)     Current Facility-Administered Medications  Medication Dose Route Frequency Provider Last Rate Last Admin   0.9 %  sodium chloride infusion  500 mL Intravenous Once Markia Kyer, Lajuan Lines, MD        Allergies as of 02/13/2022 - Review Complete 02/13/2022  Allergen  Reaction Noted   Other Swelling 05/28/2014   Sulfa drugs cross reactors  08/14/2010    Family History  Problem Relation Age of Onset   Arthritis Mother    Hypertension Father    Cancer Father        ?hepatic cancer   Colon cancer Neg Hx    Colon polyps Neg Hx    Esophageal cancer Neg Hx    Rectal cancer Neg Hx    Stomach cancer Neg Hx     Social History   Socioeconomic History   Marital status: Married    Spouse name: Not on file   Number of children: Not on file   Years of education: Not on file   Highest education level: Not on file  Occupational History   Not on file  Tobacco Use   Smoking status: Former    Packs/day: 1.00    Years: 20.00    Total pack years: 20.00    Types: Cigarettes    Quit date: 03/29/2021    Years since quitting: 0.8   Smokeless tobacco: Never  Vaping Use   Vaping Use: Never used  Substance and Sexual Activity   Alcohol use: No   Drug use: No   Sexual activity: Not on file  Other Topics Concern   Not on file  Social History Narrative   Not on file   Social Determinants of Health   Financial Resource Strain: Not on file  Food Insecurity: Not on file  Transportation Needs:  Not on file  Physical Activity: Not on file  Stress: Not on file  Social Connections: Not on file  Intimate Partner Violence: Not on file    Physical Exam: Vital signs in last 24 hours: '@BP'$  134/78   Pulse 63   Temp (!) 97.1 F (36.2 C)   Ht '5\' 8"'$  (1.727 m)   Wt 175 lb (79.4 kg)   SpO2 100%   BMI 26.61 kg/m  GEN: NAD EYE: Sclerae anicteric ENT: MMM CV: Non-tachycardic Pulm: CTA b/l GI: Soft, NT/ND NEURO:  Alert & Oriented x 3   Zenovia Jarred, MD [facility]  02/13/2022 8:27 AM

## 2022-02-13 NOTE — Progress Notes (Signed)
Called to room to assist during endoscopic procedure.  Patient ID and intended procedure confirmed with present staff. Received instructions for my participation in the procedure from the performing physician.  

## 2022-02-13 NOTE — Progress Notes (Signed)
Vss nad trans to pacu °

## 2022-02-13 NOTE — Progress Notes (Signed)
Pt's states no medical or surgical changes since previsit or office visit. 

## 2022-02-13 NOTE — Patient Instructions (Signed)
Handout on polyps provided.  Await pathology results.  YOU HAD AN ENDOSCOPIC PROCEDURE TODAY AT [facility]:   Refer to the procedure report that was given to you for any specific questions about what was found during the examination.  If the procedure report does not answer your questions, please call your gastroenterologist to clarify.  If you requested that your care partner not be given the details of your procedure findings, then the procedure report has been included in a sealed envelope for you to review at your convenience later.  YOU SHOULD EXPECT: Some feelings of bloating in the abdomen. Passage of more gas than usual.  Walking can help get rid of the air that was put into your GI tract during the procedure and reduce the bloating. If you had a lower endoscopy (such as a colonoscopy or flexible sigmoidoscopy) you may notice spotting of blood in your stool or on the toilet paper. If you underwent a bowel prep for your procedure, you may not have a normal bowel movement for a few days.  Please Note:  You might notice some irritation and congestion in your nose or some drainage.  This is from the oxygen used during your procedure.  There is no need for concern and it should clear up in a day or so.  SYMPTOMS TO REPORT IMMEDIATELY:  Following lower endoscopy (colonoscopy or flexible sigmoidoscopy):  Excessive amounts of blood in the stool  Significant tenderness or worsening of abdominal pains  Swelling of the abdomen that is new, acute  Fever of 100F or higher   For urgent or emergent issues, a gastroenterologist can be reached at any hour by calling [phone]. Do not use MyChart messaging for urgent concerns.    DIET:  We do recommend a small meal at first, but then you may proceed to your regular diet.  Drink plenty of fluids but you should avoid alcoholic beverages for 24 hours.  ACTIVITY:  You should plan to take it easy for the rest of today and you should  NOT DRIVE or use heavy machinery until tomorrow (because of the sedation medicines used during the test).    FOLLOW UP: Our staff will call the number listed on your records the next business day following your procedure.  We will call around 7:15- 8:00 am to check on you and address any questions or concerns that you may have regarding the information given to you following your procedure. If we do not reach you, we will leave a message.     If any biopsies were taken you will be contacted by phone or by letter within the next 1-3 weeks.  Please call us at [phone] if you have not heard about the biopsies in 3 weeks.    SIGNATURES/CONFIDENTIALITY: You and/or your care partner have signed paperwork which will be entered into your electronic medical record.  These signatures attest to the fact that that the information above on your After Visit Summary has been reviewed and is understood.  Full responsibility of the confidentiality of this discharge information lies with you and/or your care-partner.

## 2022-02-14 ENCOUNTER — Telehealth: Payer: Self-pay | Admitting: *Deleted

## 2022-02-14 NOTE — Telephone Encounter (Signed)
Attempted to call patient for their post-procedure follow-up call. No answer. Left voicemail.   

## 2022-02-21 ENCOUNTER — Encounter: Payer: Self-pay | Admitting: Internal Medicine

## 2022-12-21 ENCOUNTER — Encounter: Payer: BC Managed Care – PPO | Admitting: Family Medicine

## 2022-12-23 ENCOUNTER — Emergency Department (HOSPITAL_BASED_OUTPATIENT_CLINIC_OR_DEPARTMENT_OTHER): Payer: BC Managed Care – PPO | Admitting: Radiology

## 2022-12-23 ENCOUNTER — Encounter (HOSPITAL_BASED_OUTPATIENT_CLINIC_OR_DEPARTMENT_OTHER): Payer: Self-pay

## 2022-12-23 ENCOUNTER — Other Ambulatory Visit: Payer: Self-pay

## 2022-12-23 ENCOUNTER — Emergency Department (HOSPITAL_BASED_OUTPATIENT_CLINIC_OR_DEPARTMENT_OTHER)
Admission: EM | Admit: 2022-12-23 | Discharge: 2022-12-23 | Disposition: A | Discharge status: Home / Self Care | Payer: BC Managed Care – PPO

## 2022-12-23 DIAGNOSIS — R079 Chest pain, unspecified: Secondary | ICD-10-CM | POA: Diagnosis present

## 2022-12-23 DIAGNOSIS — X58XXXA Exposure to other specified factors, initial encounter: Secondary | ICD-10-CM | POA: Diagnosis not present

## 2022-12-23 DIAGNOSIS — T148XXA Other injury of unspecified body region, initial encounter: Secondary | ICD-10-CM | POA: Diagnosis not present

## 2022-12-23 DIAGNOSIS — N189 Chronic kidney disease, unspecified: Secondary | ICD-10-CM | POA: Insufficient documentation

## 2022-12-23 HISTORY — DX: Gastro-esophageal reflux disease without esophagitis: K21.9

## 2022-12-23 LAB — BASIC METABOLIC PANEL
Anion gap: 10 (ref 5–15)
BUN: 14 mg/dL (ref 6–20)
CO2: 26 mmol/L (ref 22–32)
Calcium: 10.1 mg/dL (ref 8.9–10.3)
Chloride: 103 mmol/L (ref 98–111)
Creatinine, Ser: 0.84 mg/dL (ref 0.61–1.24)
GFR, Estimated: >60 mL/min (ref >60)
Glucose, Bld: 123 mg/dL — ABNORMAL HIGH (ref 70–99)
Potassium: 3.5 mmol/L (ref 3.5–5.1)
Sodium: 139 mmol/L (ref 135–145)

## 2022-12-23 LAB — CBC
HCT: 41.4 % (ref 39.0–52.0)
Hemoglobin: 15 g/dL (ref 13.0–17.0)
MCH: 33.8 pg (ref 26.0–34.0)
MCHC: 36.2 g/dL — ABNORMAL HIGH (ref 30.0–36.0)
MCV: 93.2 fL (ref 80.0–100.0)
Platelets: 234 10*3/uL (ref 150–400)
RBC: 4.44 MIL/uL (ref 4.22–5.81)
RDW: 11.4 % — ABNORMAL LOW (ref 11.5–15.5)
WBC: 12.1 10*3/uL — ABNORMAL HIGH (ref 4.0–10.5)
nRBC: 0 % (ref 0.0–0.2)

## 2022-12-23 LAB — TROPONIN I (HIGH SENSITIVITY)
Troponin I (High Sensitivity): 3 ng/L (ref <18)
Troponin I (High Sensitivity): 4 ng/L (ref <18)

## 2022-12-23 MED ORDER — ASPIRIN 81 MG PO CHEW
324.0000 mg | CHEWABLE_TABLET | Freq: Once | ORAL | Status: AC
  Administered 2022-12-23: 324 mg via ORAL
  Filled 2022-12-23: qty 4

## 2022-12-23 MED ORDER — FAMOTIDINE IN NACL 20-0.9 MG/50ML-% IV SOLN
20.0000 mg | INTRAVENOUS | Status: AC
  Administered 2022-12-23: 20 mg via INTRAVENOUS
  Filled 2022-12-23: qty 50

## 2022-12-23 NOTE — Discharge Instructions (Addendum)
It was a pleasure caring for you today.  Workup was reassuring.  Please follow-up with your primary care provider.  Seek emergency care if experiencing any new or worsening symptoms.

## 2022-12-23 NOTE — ED Provider Notes (Signed)
[place] EMERGENCY DEPARTMENT AT [facility] Provider Note   CSN: [id number] Arrival date & time: 12/23/22  1720     History  Chief Complaint  Patient presents with   Chest Pain    ABBOT BALLOG is a 52 y.o. male with PMHx CKD, GERD who presents to ED concerned for central chest pain x5 hours. Patient stating that this symptom happened 5 years ago and was d/t acid reflux. Patient took 3 TUMS today without relief. States that it hurts more to take a deep inspiration. Also states that he was picking up his grandchild multiple times yesterday and concerned that it could be MSK related.  Denies fever, chest pain, dyspnea, cough, nausea, vomiting, diarrhea, abdominal pain. Denies recent surgery/immobilization, hx DT/PE, hemoptysis, hx cancer in the past 6 months, calf swelling/tenderness.    Chest Pain      Home Medications Prior to Admission medications   Medication Sig Start Date End Date Taking? Authorizing Provider  EPINEPHrine (EPIPEN 2-PAK) 0.3 mg/0.3 mL IJ SOAJ injection Inject 0.3 mLs (0.3 mg total) into the muscle once. Patient not taking: Reported on 01/23/2022 10/19/13   Kristian Covey, MD  methocarbamol (ROBAXIN) 500 MG tablet Take 1 tablet by mouth 2 (two) times daily. Patient not taking: Reported on 02/13/2022    [provider]      Allergies    Other and Sulfa drugs cross reactors    Review of Systems   Review of Systems  Cardiovascular:  Positive for chest pain.    Physical Exam Updated Vital Signs BP (!) 144/93   Pulse 83   Temp 99.2 F (37.3 C) (Oral)   Resp (!) 22   SpO2 99%  Physical Exam Vitals and nursing note reviewed.  Constitutional:      General: He is not in acute distress.    Appearance: He is not ill-appearing, toxic-appearing or diaphoretic.  HENT:     Head: Normocephalic and atraumatic.     Mouth/Throat:     Mouth: Mucous membranes are moist.     Pharynx: No oropharyngeal exudate or posterior  oropharyngeal erythema.  Eyes:     General: No scleral icterus.       Right eye: No discharge.        Left eye: No discharge.     Conjunctiva/sclera: Conjunctivae normal.  Cardiovascular:     Rate and Rhythm: Normal rate and regular rhythm.     Pulses: Normal pulses.          Radial pulses are 2+ on the right side and 2+ on the left side.       Dorsalis pedis pulses are 2+ on the right side and 2+ on the left side.     Heart sounds: Normal heart sounds. No murmur heard. Pulmonary:     Effort: Pulmonary effort is normal. No respiratory distress.     Breath sounds: No wheezing, rhonchi or rales.  Abdominal:     General: Bowel sounds are normal.     Palpations: Abdomen is soft.  Musculoskeletal:     Right lower leg: No edema.     Left lower leg: No edema.     Comments: No calf tenderness to palpation  Skin:    General: Skin is warm and dry.     Findings: No rash.  Neurological:     General: No focal deficit present.     Mental Status: He is alert. Mental status is at baseline.  Psychiatric:  Mood and Affect: Mood normal.        Behavior: Behavior normal.     ED Results / Procedures / Treatments   Labs (all labs ordered are listed, but only abnormal results are displayed) Labs Reviewed  BASIC METABOLIC PANEL - Abnormal; Notable for the following components:      Result Value   Glucose, Bld 123 (*)    All other components within normal limits  CBC - Abnormal; Notable for the following components:   WBC 12.1 (*)    MCHC 36.2 (*)    RDW 11.4 (*)    All other components within normal limits  TROPONIN I (HIGH SENSITIVITY)  TROPONIN I (HIGH SENSITIVITY)    EKG None  Radiology DG Chest 2 View  Result Date: 12/23/2022 CLINICAL DATA:  Upper chest pain EXAM: CHEST - 2 VIEW COMPARISON:  10/26/2016 FINDINGS: The heart size and mediastinal contours are within normal limits. Both lungs are clear. The visualized skeletal structures are unremarkable. IMPRESSION: No active  cardiopulmonary disease. Electronically Signed   By: Jasmine Pang M.D.   On: 12/23/2022 18:02    Procedures Procedures    Medications Ordered in ED Medications  aspirin chewable tablet 324 mg (324 mg Oral Given 12/23/22 1756)  famotidine (PEPCID) IVPB 20 mg premix (0 mg Intravenous Stopped 12/23/22 1827)    ED Course/ Medical Decision Making/ A&P                                 Medical Decision Making Amount and/or Complexity of Data Reviewed Labs: ordered. Radiology: ordered.  Risk OTC drugs. Prescription drug management.   This patient presents to the ED for concern of chest pain, this involves an extensive number of treatment options, and is a complaint that carries with it a high risk of complications and morbidity.  The differential diagnosis includes acute coronary syndrome, congestive heart failure, pericarditis, pneumonia, pulmonary embolism, tension pneumothorax,  aortic dissection, cardiac tamponade, musculoskeletal   Co morbidities that complicate the patient evaluation  CKD, GERD   Additional history obtained:  PCP Dr. Caryl Never   Lab Tests:  I Ordered, and personally interpreted labs.  The pertinent results include:  - Troponin: within normal limits - BMP: no concern for electrolyte abnormality; no concern for kidney damage - CBC: mild leukocytosis at 12.1, no anemia   Imaging Studies ordered:  I ordered imaging studies including  -chest xray: To assess for process contributing to patient's symptoms I independently visualized and interpreted imaging I agree with the radiologist interpretation   Cardiac Monitoring: / EKG:  The patient was maintained on a cardiac monitor.  I personally viewed and interpreted the cardiac monitored which showed an underlying rhythm of: sinus rhythm without acute ST changes or arrhythmias   Problem List / ED Course / Critical interventions / Medication management  Patient presents to ED concern for central chest  pain x 5 hours.  Took 3 Tums without relief.  Physical exam unremarkable.  PERC 0.  Patient afebrile with stable vitals.  Provided patient with ASA and Pepcid. CBC with mild leukocytosis at 12.1, but patient is denying any infectious symptoms today.  No anemia.  Initial and repeat troponin within normal limits.  BMP reassuring.  EKG reassuring.  Chest x-ray without acute cardiopulmonary disease. Patient stating that he was lifting up his grandchild multiple times yesterday.  Patient wondering if it is MSK related.  The patient on taking ibuprofen and  Tylenol for pain management. Shared results with patient and recommended following up with primary care provider.  Patient verbalized understanding of plan. I have reviewed the patients home medicines and have made adjustments as needed Patient was given return precautions. Patient stable for discharge at this time.  Patient verbalized understanding of plan.  Ddx:  These are considered less likely due to history of present illness and physical exam findings.  -Acute coronary syndrome: EKG and troponins within normal limits  -Congestive heart failure: Physical exam without pitting edema or hypoxia -Pericarditis: patient denies orthopnea and recent illness -Pneumonia: lungs are clear to auscultation bilaterally -Pulmonary embolism: no recent surgeries, blood clot hx, hemoptysis, cancer hx, vitals stable -Pneumothorax: lungs are clear to auscultation bilaterally -Aortic dissection: vital signs are stable, no variation in pulse pressure -Cardiac tamponade: Chest x-ray without concern    Social Determinants of Health:  none          Final Clinical Impression(s) / ED Diagnoses Final diagnoses:  Muscle strain  Chest pain, unspecified type    Rx / DC Orders ED Discharge Orders     None         Dorthy Cooler, [place] 12/23/22 2108    Coral Spikes, DO 12/23/22 2317

## 2022-12-28 ENCOUNTER — Ambulatory Visit (INDEPENDENT_AMBULATORY_CARE_PROVIDER_SITE_OTHER): Payer: BC Managed Care – PPO | Admitting: Family Medicine

## 2022-12-28 ENCOUNTER — Encounter: Payer: Self-pay | Admitting: Family Medicine

## 2022-12-28 VITALS — BP 110/76 | HR 65 | Temp 97.9°F | Ht 68.5 in | Wt 189.7 lb

## 2022-12-28 DIAGNOSIS — Z0000 Encounter for general adult medical examination without abnormal findings: Secondary | ICD-10-CM

## 2022-12-28 DIAGNOSIS — Z122 Encounter for screening for malignant neoplasm of respiratory organs: Secondary | ICD-10-CM

## 2022-12-28 LAB — HEPATIC FUNCTION PANEL
ALT: 15 U/L (ref 0–53)
AST: 15 U/L (ref 0–37)
Albumin: 4.4 g/dL (ref 3.5–5.2)
Alkaline Phosphatase: 66 U/L (ref 39–117)
Bilirubin, Direct: 0.1 mg/dL (ref 0.0–0.3)
Total Bilirubin: 0.5 mg/dL (ref 0.2–1.2)
Total Protein: 6.6 g/dL (ref 6.0–8.3)

## 2022-12-28 LAB — CBC WITH DIFFERENTIAL/PLATELET
Basophils Absolute: 0 10*3/uL (ref 0.0–0.1)
Basophils Relative: 0.9 % (ref 0.0–3.0)
Eosinophils Absolute: 0.1 10*3/uL (ref 0.0–0.7)
Eosinophils Relative: 2.1 % (ref 0.0–5.0)
HCT: 40.3 % (ref 39.0–52.0)
Hemoglobin: 13.9 g/dL (ref 13.0–17.0)
Lymphocytes Relative: 35 % (ref 12.0–46.0)
Lymphs Abs: 1.7 10*3/uL (ref 0.7–4.0)
MCHC: 34.3 g/dL (ref 30.0–36.0)
MCV: 96.6 fL (ref 78.0–100.0)
Monocytes Absolute: 0.3 10*3/uL (ref 0.1–1.0)
Monocytes Relative: 6.3 % (ref 3.0–12.0)
Neutro Abs: 2.7 10*3/uL (ref 1.4–7.7)
Neutrophils Relative %: 55.7 % (ref 43.0–77.0)
Platelets: 254 10*3/uL (ref 150.0–400.0)
RBC: 4.18 Mil/uL — ABNORMAL LOW (ref 4.22–5.81)
RDW: 11.8 % (ref 11.5–15.5)
WBC: 4.8 10*3/uL (ref 4.0–10.5)

## 2022-12-28 LAB — BASIC METABOLIC PANEL
BUN: 20 mg/dL (ref 6–23)
CO2: 26 meq/L (ref 19–32)
Calcium: 9.3 mg/dL (ref 8.4–10.5)
Chloride: 106 meq/L (ref 96–112)
Creatinine, Ser: 0.86 mg/dL (ref 0.40–1.50)
GFR: 99.39 mL/min (ref >60.00)
Glucose, Bld: 93 mg/dL (ref 70–99)
Potassium: 4.1 meq/L (ref 3.5–5.1)
Sodium: 141 meq/L (ref 135–145)

## 2022-12-28 LAB — LIPID PANEL
Cholesterol: 194 mg/dL (ref 0–200)
HDL: 45.2 mg/dL (ref >39.00)
LDL Cholesterol: 136 mg/dL — ABNORMAL HIGH (ref 0–99)
NonHDL: 148.91
Total CHOL/HDL Ratio: 4
Triglycerides: 67 mg/dL (ref 0.0–149.0)
VLDL: 13.4 mg/dL (ref 0.0–40.0)

## 2022-12-28 LAB — PSA: PSA: 1 ng/mL (ref 0.10–4.00)

## 2022-12-28 NOTE — Patient Instructions (Signed)
Consider Shingles vaccine at some point this year.

## 2022-12-28 NOTE — Progress Notes (Signed)
Established Patient Office Visit  Subjective   Patient ID: Manuel Page, male    DOB: December 04, 1970  Age: 52 y.o. MRN: [id number]  Chief Complaint  Patient presents with   Annual Exam    HPI   Manuel Page is here for physical exam.  Generally fairly healthy.  Takes no regular medications.  Quit smoking was 2 years ago.  Roughly 35-pack-year history.  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  He had recent ER visit for atypical chest pain.  Pain with deep breathing.  EKG and labs unremarkable.  In hindsight, he thinks this was exacerbated after lifting nephews.  Improved after Advil.  Health maintenance reviewed  Health Maintenance  Topic Date Due   HIV Screening  Never done   Lung Cancer Screening  Never done   Zoster Vaccines- Shingrix (1 of 2) Never done   COVID-19 Vaccine (3 - 2023-24 season) 10/28/2022   DTaP/Tdap/Td (3 - Td or Tdap) 10/08/2023   Colonoscopy  02/13/2029   INFLUENZA VACCINE  Completed   Hepatitis C Screening  Completed   HPV VACCINES  Aged Out   -Flu vaccine already given -He is a candidate for low-dose CT lung cancer screening  Social history-married.  No children.  Quit smoking January.  2023 no regular alcohol.  Government social research officer at [employer].   Family history-father died of hepatic cancer uncertain whether this was primary or metastatic.  Mother has history of obesity and some chronic back difficulties.  Sister alive and well.  Past Medical History:  Diagnosis Date   Chronic kidney disease    stones   GERD (gastroesophageal reflux disease)    Prostate infection    History reviewed. No pertinent surgical history.  reports that he quit smoking about 21 months ago. His smoking use included cigarettes. He started smoking about 21 years ago. He has a 20 pack-year smoking history. He has never used smokeless tobacco. He reports that he does not drink alcohol and does not use drugs. family  history includes Arthritis in his mother; Cancer in his father; Hypertension in his father. Allergies  Allergen Reactions   Other Swelling   Sulfa Drugs Cross Reactors     Eye swelling     Review of Systems  Constitutional:  Negative for chills, fever, malaise/fatigue and weight loss.  HENT:  Negative for hearing loss.   Eyes:  Negative for blurred vision and double vision.  Respiratory:  Negative for cough and shortness of breath.   Cardiovascular:  Negative for chest pain, palpitations and leg swelling.  Gastrointestinal:  Negative for abdominal pain, blood in stool, constipation and diarrhea.  Genitourinary:  Negative for dysuria.  Skin:  Negative for rash.  Neurological:  Negative for dizziness, speech change, seizures, loss of consciousness and headaches.  Psychiatric/Behavioral:  Negative for depression.       Objective:     BP 110/76 (BP Location: Left Arm, Patient Position: Sitting, Cuff Size: Normal)   Pulse 65   Temp 97.9 F (36.6 C) (Oral)   Ht 5' 8.5" (1.74 m)   Wt 189 lb 11.2 oz (86 kg)   SpO2 96%   BMI 28.42 kg/m  BP Readings from Last 3 Encounters:  12/28/22 110/76  12/23/22 (!) 144/93  02/13/22 121/79   Wt Readings from Last 3 Encounters:  12/28/22 189 lb 11.2 oz (86 kg)  02/13/22 175 lb (79.4 kg)  01/23/22 175 lb (79.4 kg)      Physical Exam Vitals reviewed.  Constitutional:      General: He is not in acute distress.    Appearance: He is well-developed. He is not ill-appearing.  HENT:     Head: Normocephalic and atraumatic.     Right Ear: External ear normal.     Left Ear: External ear normal.  Eyes:     Conjunctiva/sclera: Conjunctivae normal.     Pupils: Pupils are equal, round, and reactive to light.  Neck:     Thyroid: No thyromegaly.  Cardiovascular:     Rate and Rhythm: Normal rate and regular rhythm.     Heart sounds: Normal heart sounds. No murmur heard. Pulmonary:     Effort: No respiratory distress.     Breath sounds: No  wheezing or rales.  Abdominal:     General: Bowel sounds are normal. There is no distension.     Palpations: Abdomen is soft. There is no mass.     Tenderness: There is no abdominal tenderness. There is no guarding or rebound.  Musculoskeletal:     Cervical back: Normal range of motion and neck supple.     Right lower leg: No edema.     Left lower leg: No edema.  Lymphadenopathy:     Cervical: No cervical adenopathy.  Skin:    Findings: No rash.  Neurological:     Mental Status: He is alert and oriented to person, place, and time.     Cranial Nerves: No cranial nerve deficit.      No results found for any visits on 12/28/22.    The 10-year ASCVD risk score (Arnett DK, et al., 2019) is: 8%    Assessment & Plan:   Problem List Items Addressed This Visit   None Visit Diagnoses     Physical exam    -  Primary   Relevant Orders   Basic metabolic panel   Lipid panel   CBC with Differential/Platelet   Hepatic function panel   PSA   Screening for lung cancer       Relevant Orders   Ambulatory Referral for Lung Cancer Scre     Here for physical exam.  Quit smoking almost 2 years ago.  35-pack-year history.  We discussed the following health maintenance items  -Recommend he consider low-dose CT lung cancer screening.  He does agree and this will be set up -Also discussed possible coronary calcium scan and he was given information he will consider -Continue annual flu vaccine -Obtain follow-up labs as above -Consider Shingrix vaccine at some point this year  No follow-ups on file.    Evelena Peat, MD

## 2023-03-15 ENCOUNTER — Other Ambulatory Visit: Payer: Self-pay | Admitting: Emergency Medicine

## 2023-03-15 ENCOUNTER — Telehealth: Payer: Self-pay | Admitting: Acute Care

## 2023-03-15 DIAGNOSIS — Z87891 Personal history of nicotine dependence: Secondary | ICD-10-CM

## 2023-03-15 DIAGNOSIS — Z122 Encounter for screening for malignant neoplasm of respiratory organs: Secondary | ICD-10-CM

## 2023-03-15 NOTE — Telephone Encounter (Signed)
Lung Cancer Screening Narrative/Criteria Questionnaire (Cigarette Smokers Only- No Cigars/Pipes/vapes)   Manuel Page   SDMV:03/25/2023 at 845a with Dirk Dress NP        [birth year]   LDCT: 04/04/2023 at 4:00p at [facility]     53 y.o.   Phone: [phone]  Lung Screening Narrative (confirm age 27-77 yrs Medicare / 50-80 yrs Private pay insurance)   Insurance information:[employer] ID: [id number]   Referring Provider:Dr. Caryl Never    This screening involves an initial phone call with a team member from our program. It is called a shared decision making visit. The initial meeting is required by  insurance and Medicare to make sure you understand the program. This appointment takes about 15-20 minutes to complete. You will complete the screening scan at your scheduled date/time.  This scan takes about 5-10 minutes to complete. You can eat and drink normally before and after the scan.  Criteria questions for Lung Cancer Screening:   Are you a current or former smoker? Former Age began smoking: 53yo   If you are a former smoker, what year did you quit smoking? 2023(within 15 yrs)   To calculate your smoking history, I need an accurate estimate of how many packs of cigarettes you smoked per day and for how many years. (Not just the number of PPD you are now smoking)   Years smoking 35 x Packs per day 1 = Pack years 35   (at least 20 pack yrs)   (Make sure they understand that we need to know how much they have smoked in the past, not just the number of PPD they are smoking now)  Do you have a personal history of cancer?  No    Do you have a family history of cancer? No  Are you coughing up blood?  No  Have you had unexplained weight loss of 15 lbs or more in the last 6 months? No  It looks like you meet all criteria.  When would be a good time for [place] to schedule you for this screening?   Additional information: N/A

## 2023-03-25 ENCOUNTER — Ambulatory Visit (INDEPENDENT_AMBULATORY_CARE_PROVIDER_SITE_OTHER): Payer: 59 | Admitting: Adult Health

## 2023-03-25 ENCOUNTER — Encounter: Payer: Self-pay | Admitting: Adult Health

## 2023-03-25 DIAGNOSIS — Z87891 Personal history of nicotine dependence: Secondary | ICD-10-CM

## 2023-03-25 NOTE — Progress Notes (Signed)
  Virtual Visit via Telephone Note  I connected with Manuel Page , 03/25/23 8:47 AM by a telemedicine application and verified that I am speaking with the correct person using two identifiers.  Location: Patient: home Provider: home   I discussed the limitations of evaluation and management by telemedicine and the availability of in person appointments. The patient expressed understanding and agreed to proceed.   Shared Decision Making Visit Lung Cancer Screening Program [phone])   Eligibility: 53 y.o. Pack Years Smoking History Calculation = 35 pack years  (# packs/per year x # years smoked) Recent History of coughing up blood  no Unexplained weight loss? no ( >Than 15 pounds within the last 6 months ) Prior History Lung / other cancer no (Diagnosis within the last 5 years already requiring surveillance chest CT Scans). Smoking Status Former Smoker Former Smokers: Years since quit: 2 years  Quit Date: 2023  Visit Components: Discussion included one or more decision making aids. YES Discussion included risk/benefits of screening. YES Discussion included potential follow up diagnostic testing for abnormal scans. YES Discussion included meaning and risk of over diagnosis. YES Discussion included meaning and risk of False Positives. YES Discussion included meaning of total radiation exposure. YES  Counseling Included: Importance of adherence to annual lung cancer LDCT screening. YES Impact of comorbidities on ability to participate in the program. YES Ability and willingness to under diagnostic treatment. YES  Smoking Cessation Counseling: Former Smokers:  Discussed the importance of maintaining cigarette abstinence. yes Diagnosis Code: Personal History of Nicotine Dependence. U04.540 Information about tobacco cessation classes and interventions provided to patient. Yes Patient provided with "ticket" for LDCT Scan. yes Written Order for Lung Cancer Screening with LDCT  placed in Epic. Yes (CT Chest Lung Cancer Screening Low Dose W/O CM) [id number]  Z12.2-Screening of respiratory organs Z87.891-Personal history of nicotine dependence   Danford Bad 03/25/23

## 2023-03-25 NOTE — Patient Instructions (Signed)

## 2023-03-29 ENCOUNTER — Encounter: Payer: Self-pay | Admitting: Acute Care

## 2023-04-04 ENCOUNTER — Ambulatory Visit
Admission: RE | Admit: 2023-04-04 | Discharge: 2023-04-04 | Disposition: A | Discharge status: Home / Self Care | Payer: 59 | Source: Ambulatory Visit | Attending: Acute Care | Admitting: Acute Care

## 2023-04-04 DIAGNOSIS — Z87891 Personal history of nicotine dependence: Secondary | ICD-10-CM

## 2023-04-04 DIAGNOSIS — Z122 Encounter for screening for malignant neoplasm of respiratory organs: Secondary | ICD-10-CM

## 2023-04-18 ENCOUNTER — Telehealth: Payer: Self-pay | Admitting: Acute Care

## 2023-04-18 NOTE — Telephone Encounter (Signed)
CT of the Chest from February 6th

## 2023-04-19 ENCOUNTER — Telehealth: Payer: Self-pay | Admitting: Acute Care

## 2023-04-19 NOTE — Telephone Encounter (Signed)
IMPRESSION: 1. Lung-RADS 2S, benign appearance or behavior. Continue annual screening with low-dose chest CT without contrast in 12 months. 2. The "S" modifier represents a potentially clinically significant non pulmonary finding. 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 Correlate with upper abdominal complaints. Especially if any upper abdominal complaints, consider further evaluation with contrast enhanced abdominal CT to exclude unlikely mass. If no abdominal complaints, consider further evaluation with abdominal ultrasound. 3. Aortic Atherosclerosis (ICD10-I70.0) and Emphysema (ICD10-J43.9). 4. Age advanced coronary artery atherosclerosis. Recommend assessment of coronary risk factors.

## 2023-04-19 NOTE — Telephone Encounter (Signed)
Please call patient and let him know his scan was read as a LR 2, twelve month follow up,Fax results to PCP and let him know. 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 Radiology recommends Correlation  with upper abdominal complaints. Ask if he has any abdominal issues, especially if any upper abdominal complaints. Consider further evaluation with contrast enhanced abdominal CT to exclude unlikely mass. If no abdominal complaints, consider further evaluation with abdominal ultrasound.  Let him know we will reach out to his PCP to ask them to follow up on this finding. Can you all call Dr. Lucie Leather office and ask if he will do the follow up imaging as he feels is clinically appropriate and manage findings? Thanks so much Thanks so much

## 2023-04-22 NOTE — Telephone Encounter (Signed)
 Attempted to reach patient to discuss results. No answer, LVM to call office.

## 2023-04-22 NOTE — Telephone Encounter (Signed)
 See phone note from 04/19/2023.

## 2023-04-22 NOTE — Telephone Encounter (Signed)
 Good Morning Dr. Caryl Never,  Results have been reviewed with the patient. He will follow up with your office to discuss additional imaging for incidental findings. He currently has no abdominal complaints. Results have been forwarded to your office. Please let me know if you need anything else. Thank you!  Revonda Standard, RN

## 2023-04-22 NOTE — Telephone Encounter (Signed)
 Spoke with patient and reviewed results. He denies any abdominal discomfort. Results sent to Dr. Caryl Never and patient advises he will outreach him to discuss additional imaging.

## 2023-04-23 ENCOUNTER — Encounter: Payer: Self-pay | Admitting: Family Medicine

## 2023-04-23 DIAGNOSIS — R935 Abnormal findings on diagnostic imaging of other abdominal regions, including retroperitoneum: Secondary | ICD-10-CM

## 2023-04-23 DIAGNOSIS — R1900 Intra-abdominal and pelvic swelling, mass and lump, unspecified site: Secondary | ICD-10-CM

## 2023-04-26 NOTE — Telephone Encounter (Signed)
 I have ordered abdominal ultrasound.  Let him know he should be getting a call regarding this.  Kristian Covey MD [facility]

## 2023-05-07 ENCOUNTER — Ambulatory Visit (HOSPITAL_BASED_OUTPATIENT_CLINIC_OR_DEPARTMENT_OTHER)
Admission: RE | Admit: 2023-05-07 | Discharge: 2023-05-07 | Disposition: A | Discharge status: Home / Self Care | Source: Ambulatory Visit | Attending: Family Medicine | Admitting: Family Medicine

## 2023-05-07 DIAGNOSIS — R935 Abnormal findings on diagnostic imaging of other abdominal regions, including retroperitoneum: Secondary | ICD-10-CM | POA: Diagnosis present

## 2023-05-09 NOTE — Addendum Note (Signed)
 Addended by: Kristian Covey on: 05/09/2023 01:11 PM   Modules accepted: Orders

## 2023-05-12 ENCOUNTER — Ambulatory Visit (HOSPITAL_BASED_OUTPATIENT_CLINIC_OR_DEPARTMENT_OTHER)
Admission: RE | Admit: 2023-05-12 | Discharge: 2023-05-12 | Disposition: A | Discharge status: Home / Self Care | Source: Ambulatory Visit | Attending: Family Medicine | Admitting: Family Medicine

## 2023-05-12 DIAGNOSIS — R1900 Intra-abdominal and pelvic swelling, mass and lump, unspecified site: Secondary | ICD-10-CM | POA: Insufficient documentation

## 2023-05-12 MED ORDER — IOHEXOL 350 MG/ML SOLN
100.0000 mL | Freq: Once | INTRAVENOUS | Status: AC | PRN
  Administered 2023-05-12: 100 mL via INTRAVENOUS

## 2023-05-17 ENCOUNTER — Encounter: Payer: Self-pay | Admitting: Family Medicine

## 2023-05-17 NOTE — Telephone Encounter (Signed)
 Gabe with the reading room has placed CT as stat

## 2023-05-20 ENCOUNTER — Telehealth: Payer: Self-pay

## 2023-05-20 NOTE — Telephone Encounter (Signed)
 Copied from CRM [id number]. Topic: General - Other >> May 20, 2023 12:33 PM Alcus Dad wrote: Reason for CRM: please give patient a call back pertaining to his CT results

## 2023-05-20 NOTE — Telephone Encounter (Signed)
 Please see result note

## 2023-08-20 ENCOUNTER — Other Ambulatory Visit: Payer: Self-pay | Admitting: Acute Care

## 2023-08-20 DIAGNOSIS — Z122 Encounter for screening for malignant neoplasm of respiratory organs: Secondary | ICD-10-CM

## 2023-08-20 DIAGNOSIS — Z87891 Personal history of nicotine dependence: Secondary | ICD-10-CM

## 2023-11-22 ENCOUNTER — Encounter: Payer: Self-pay | Admitting: Family Medicine

## 2023-11-22 ENCOUNTER — Ambulatory Visit: Admitting: Family Medicine

## 2023-11-22 VITALS — BP 110/72 | HR 71 | Temp 98.6°F | Wt 193.6 lb

## 2023-11-22 DIAGNOSIS — R21 Rash and other nonspecific skin eruption: Secondary | ICD-10-CM

## 2023-11-22 MED ORDER — TRIAMCINOLONE ACETONIDE 0.1 % EX CREA: 1.0000 | TOPICAL_CREAM | Freq: Three times a day (TID) | CUTANEOUS | 0 refills | Status: AC

## 2023-11-22 NOTE — Progress Notes (Signed)
   Subjective:    Patient ID: Manuel Page, male    DOB: April 06, 1970, 53 y.o.   MRN: [id number]  HPI Here for an itchy rash in the left pubic area. OTC cortisone cream has not helped. He notes that his wife changed to a new laundry detergent 2 weeks ago.   Review of Systems  Constitutional: Negative.   Respiratory: Negative.    Cardiovascular: Negative.   Skin:  Positive for rash.       Objective:   Physical Exam Constitutional:      Appearance: Normal appearance.  Cardiovascular:     Rate and Rhythm: Normal rate and regular rhythm.     Pulses: Normal pulses.     Heart sounds: Normal heart sounds.  Pulmonary:     Effort: Pulmonary effort is normal.     Breath sounds: Normal breath sounds.  Skin:    Comments: There is an erythematous macular rash over the left pubic area   Neurological:     Mental Status: He is alert.           Assessment & Plan:  This rash looks more allergic in nature than fungal. We will treat with Triamcinolone  cream as needed. He will follow up if any other areas become involved.  Garnette Olmsted, MD

## 2023-12-23 ENCOUNTER — Telehealth: Payer: Self-pay

## 2023-12-23 DIAGNOSIS — Z0000 Encounter for general adult medical examination without abnormal findings: Secondary | ICD-10-CM

## 2023-12-23 NOTE — Telephone Encounter (Signed)
 Copied from CRM [id number]. Topic: Clinical - Request for Lab/Test Order >> Dec 23, 2023  9:09 AM Manuel Page wrote: Reason for CRM: Patient has an appt with Dr. Micheal on 11/07 for a physical and is requesting to have labs done prior to appointment.   [phone] Spouse Angie for contact

## 2023-12-24 NOTE — Telephone Encounter (Signed)
 Labs placed and patient informed to schedule lab appt.

## 2023-12-24 NOTE — Addendum Note (Signed)
 Addended by: METTA KRISTEN CROME on: 12/24/2023 08:27 AM   Modules accepted: Orders

## 2023-12-27 ENCOUNTER — Encounter: Payer: Self-pay | Admitting: Family Medicine

## 2024-01-01 ENCOUNTER — Other Ambulatory Visit (INDEPENDENT_AMBULATORY_CARE_PROVIDER_SITE_OTHER)

## 2024-01-01 DIAGNOSIS — Z0000 Encounter for general adult medical examination without abnormal findings: Secondary | ICD-10-CM | POA: Diagnosis not present

## 2024-01-01 LAB — COMPREHENSIVE METABOLIC PANEL WITH GFR
ALT: 17 U/L (ref 0–53)
AST: 16 U/L (ref 0–37)
Albumin: 4.6 g/dL (ref 3.5–5.2)
Alkaline Phosphatase: 62 U/L (ref 39–117)
BUN: 15 mg/dL (ref 6–23)
CO2: 26 meq/L (ref 19–32)
Calcium: 9.2 mg/dL (ref 8.4–10.5)
Chloride: 106 meq/L (ref 96–112)
Creatinine, Ser: 0.85 mg/dL (ref 0.40–1.50)
GFR: 99.03 mL/min (ref >60.00)
Glucose, Bld: 86 mg/dL (ref 70–99)
Potassium: 4 meq/L (ref 3.5–5.1)
Sodium: 139 meq/L (ref 135–145)
Total Bilirubin: 0.8 mg/dL (ref 0.2–1.2)
Total Protein: 6.6 g/dL (ref 6.0–8.3)

## 2024-01-01 LAB — LIPID PANEL
Cholesterol: 202 mg/dL — ABNORMAL HIGH (ref 0–200)
HDL: 44.4 mg/dL (ref >39.00)
LDL Cholesterol: 142 mg/dL — ABNORMAL HIGH (ref 0–99)
NonHDL: 157.33
Total CHOL/HDL Ratio: 5
Triglycerides: 78 mg/dL (ref 0.0–149.0)
VLDL: 15.6 mg/dL (ref 0.0–40.0)

## 2024-01-01 LAB — CBC WITH DIFFERENTIAL/PLATELET
Basophils Absolute: 0 K/uL (ref 0.0–0.1)
Basophils Relative: 1 % (ref 0.0–3.0)
Eosinophils Absolute: 0.1 K/uL (ref 0.0–0.7)
Eosinophils Relative: 1.5 % (ref 0.0–5.0)
HCT: 41.5 % (ref 39.0–52.0)
Hemoglobin: 14.5 g/dL (ref 13.0–17.0)
Lymphocytes Relative: 29.8 % (ref 12.0–46.0)
Lymphs Abs: 1.3 K/uL (ref 0.7–4.0)
MCHC: 34.8 g/dL (ref 30.0–36.0)
MCV: 95.8 fl (ref 78.0–100.0)
Monocytes Absolute: 0.3 K/uL (ref 0.1–1.0)
Monocytes Relative: 6.8 % (ref 3.0–12.0)
Neutro Abs: 2.7 K/uL (ref 1.4–7.7)
Neutrophils Relative %: 60.9 % (ref 43.0–77.0)
Platelets: 223 K/uL (ref 150.0–400.0)
RBC: 4.34 Mil/uL (ref 4.22–5.81)
RDW: 12.2 % (ref 11.5–15.5)
WBC: 4.5 K/uL (ref 4.0–10.5)

## 2024-01-02 LAB — PSA: PSA: 0.53 ng/mL (ref 0.10–4.00)

## 2024-01-03 ENCOUNTER — Encounter: Payer: Self-pay | Admitting: Family Medicine

## 2024-01-03 ENCOUNTER — Ambulatory Visit: Payer: Self-pay | Admitting: Family Medicine

## 2024-01-03 ENCOUNTER — Ambulatory Visit (INDEPENDENT_AMBULATORY_CARE_PROVIDER_SITE_OTHER): Admitting: Family Medicine

## 2024-01-03 ENCOUNTER — Encounter: Admitting: Family Medicine

## 2024-01-03 VITALS — BP 120/90 | HR 69 | Temp 97.7°F | Ht 68.11 in | Wt 190.7 lb

## 2024-01-03 DIAGNOSIS — E785 Hyperlipidemia, unspecified: Secondary | ICD-10-CM

## 2024-01-03 DIAGNOSIS — R6882 Decreased libido: Secondary | ICD-10-CM | POA: Diagnosis not present

## 2024-01-03 DIAGNOSIS — Z23 Encounter for immunization: Secondary | ICD-10-CM | POA: Diagnosis not present

## 2024-01-03 DIAGNOSIS — Z0000 Encounter for general adult medical examination without abnormal findings: Secondary | ICD-10-CM

## 2024-01-03 LAB — TESTOSTERONE: Testosterone: 387.34 ng/dL (ref 300.00–890.00)

## 2024-01-03 NOTE — Patient Instructions (Signed)
 Consider the following vaccines:   Prevnar 20, Shingrix, Tdap  We are setting up coronary calcium score.

## 2024-01-03 NOTE — Progress Notes (Signed)
 Established Patient Office Visit  Subjective   Patient ID: Manuel Page, male    DOB: May 09, 1970  Age: 53 y.o. MRN: [id number]  Chief Complaint  Patient presents with   Annual Exam    HPI   Manuel Page is here for physical exam.  He quit smoking 3 years ago.  He had low-dose CT lung cancer screen last year with some small nodules.  There is mention of aortic atherosclerosis and left anterior descending calcification.  He denies any recent chest pains.  No family history of premature CAD.  He takes no regular medications.  He does have history of elevated LDL cholesterol 142.  He has had some recent low libido and is requesting testosterone level.  Health maintenance reviewed:  -Needs Tdap, Shingrix, Prevnar 20.  He will consider getting these at some point this year but declines today -Colonoscopy up-to-date -Recommend continued annual low-dose CT lung cancer screen  Social history-married.  No children.  Quit smoking about 3 years ago.  No regular alcohol.  Currently government social research officer at [employer].  Family history-father died of liver cancer and not sure regarding origin.  His mother is in her 79s and has congestive heart failure.  No known family history of diabetes or premature CAD.  He has a sister alive and well.  Past Medical History:  Diagnosis Date   Chronic kidney disease    stones   GERD (gastroesophageal reflux disease)    Prostate infection    History reviewed. No pertinent surgical history.  reports that he quit smoking about 2 years ago. His smoking use included cigarettes. He started smoking about 22 years ago. He has a 20 pack-year smoking history. He has never used smokeless tobacco. He reports that he does not drink alcohol and does not use drugs. family history includes Arthritis in his mother; Cancer in his father; Hypertension in his father. Allergies  Allergen Reactions   Other Swelling   Sulfa Drugs Cross Reactors     Eye swelling     Review of Systems  Constitutional:  Negative for chills, fever, malaise/fatigue and weight loss.  HENT:  Negative for hearing loss.   Eyes:  Negative for blurred vision and double vision.  Respiratory:  Negative for cough and shortness of breath.   Cardiovascular:  Negative for chest pain, palpitations and leg swelling.  Gastrointestinal:  Negative for abdominal pain, blood in stool, constipation and diarrhea.  Genitourinary:  Negative for dysuria.  Skin:  Negative for rash.  Neurological:  Negative for dizziness, speech change, seizures, loss of consciousness and headaches.  Psychiatric/Behavioral:  Negative for depression.       Objective:     BP (!) 120/90   Pulse 69   Temp 97.7 F (36.5 C) (Oral)   Ht 5' 8.11 (1.73 m)   Wt 190 lb 11.2 oz (86.5 kg)   SpO2 97%   BMI 28.90 kg/m  BP Readings from Last 3 Encounters:  01/03/24 (!) 120/90  11/22/23 110/72  12/28/22 110/76   Wt Readings from Last 3 Encounters:  01/03/24 190 lb 11.2 oz (86.5 kg)  11/22/23 193 lb 9.6 oz (87.8 kg)  12/28/22 189 lb 11.2 oz (86 kg)      Physical Exam Vitals reviewed.  Constitutional:      General: He is not in acute distress.    Appearance: He is well-developed. He is not ill-appearing.  HENT:     Head: Normocephalic and atraumatic.     Right Ear: External ear  normal.     Left Ear: External ear normal.  Eyes:     Conjunctiva/sclera: Conjunctivae normal.     Pupils: Pupils are equal, round, and reactive to light.  Neck:     Thyroid : No thyromegaly.  Cardiovascular:     Rate and Rhythm: Normal rate and regular rhythm.     Heart sounds: Normal heart sounds. No murmur heard. Pulmonary:     Effort: No respiratory distress.     Breath sounds: No wheezing or rales.  Abdominal:     General: Bowel sounds are normal. There is no distension.     Palpations: Abdomen is soft. There is no mass.     Tenderness: There is no abdominal tenderness. There is no guarding or rebound.   Musculoskeletal:     Cervical back: Normal range of motion and neck supple.     Right lower leg: No edema.     Left lower leg: No edema.  Lymphadenopathy:     Cervical: No cervical adenopathy.  Skin:    Findings: No rash.  Neurological:     Mental Status: He is alert and oriented to person, place, and time.     Cranial Nerves: No cranial nerve deficit.      No results found for any visits on 01/03/24.  Last CBC Lab Results  Component Value Date   WBC 4.5 01/01/2024   HGB 14.5 01/01/2024   HCT 41.5 01/01/2024   MCV 95.8 01/01/2024   MCH 33.8 12/23/2022   RDW 12.2 01/01/2024   PLT 223.0 01/01/2024   Last metabolic panel Lab Results  Component Value Date   GLUCOSE 86 01/01/2024   NA 139 01/01/2024   K 4.0 01/01/2024   CL 106 01/01/2024   CO2 26 01/01/2024   BUN 15 01/01/2024   CREATININE 0.85 01/01/2024   GFR 99.03 01/01/2024   CALCIUM 9.2 01/01/2024   PROT 6.6 01/01/2024   ALBUMIN 4.6 01/01/2024   BILITOT 0.8 01/01/2024   ALKPHOS 62 01/01/2024   AST 16 01/01/2024   ALT 17 01/01/2024   ANIONGAP 10 12/23/2022   Last lipids Lab Results  Component Value Date   CHOL 202 (H) 01/01/2024   HDL 44.40 01/01/2024   LDLCALC 142 (H) 01/01/2024   TRIG 78.0 01/01/2024   CHOLHDL 5 01/01/2024      The 89-bzjm ASCVD risk score (Arnett DK, et al., 2019) is: 4.7%    Assessment & Plan:   Physical exam.  He has history of elevated LDL cholesterol with recent LDL of 142.  Previous mention of aortic atherosclerosis and LAD calcification on low-dose CT lung cancer screen.  He quit smoking about 3 years ago.  We discussed the following health maintenance items  -Strongly advise coronary calcium score to further risk stratify and low threshold to start statin based on his recent LDL and mention of aortic atherosclerosis and LAD calcification.  He would like to get coronary calcium scan back first - Recommend flu vaccine and he consents - Reviewed labs and these were  unremarkable except for elevated LDL and total cholesterol as above - Recommend the following vaccines: Tdap, Shingrix, Prevnar 20.  He will consider getting these at some point this year - Patient has had low libido and is requesting total testosterone level.  This will be drawn today  No follow-ups on file.    Wolm Scarlet, MD

## 2024-02-11 ENCOUNTER — Ambulatory Visit (HOSPITAL_BASED_OUTPATIENT_CLINIC_OR_DEPARTMENT_OTHER)
Admission: RE | Admit: 2024-02-11 | Discharge: 2024-02-11 | Payer: Self-pay | Attending: Family Medicine | Admitting: Family Medicine

## 2024-02-11 DIAGNOSIS — E785 Hyperlipidemia, unspecified: Secondary | ICD-10-CM | POA: Insufficient documentation

## 2024-02-17 MED ORDER — ROSUVASTATIN CALCIUM 10 MG PO TABS: 10.0000 mg | ORAL_TABLET | Freq: Every day | ORAL | 0 refills | Status: AC

## 2024-02-17 NOTE — Addendum Note (Signed)
 Addended by: METTA KRISTEN CROME on: 02/17/2024 10:31 AM   Modules accepted: Orders
